# Patient Record
Sex: Female | Born: 1985 | Race: White | Hispanic: No | Marital: Married | Smoking: Never smoker
Health system: Southern US, Community
[De-identification: ages and names within clinical notes are randomized; demographics above are authoritative.]

## PROBLEM LIST (undated history)

## (undated) DIAGNOSIS — H9190 Unspecified hearing loss, unspecified ear: Secondary | ICD-10-CM

## (undated) DIAGNOSIS — N189 Chronic kidney disease, unspecified: Secondary | ICD-10-CM

## (undated) DIAGNOSIS — F419 Anxiety disorder, unspecified: Secondary | ICD-10-CM

## (undated) DIAGNOSIS — Z9889 Other specified postprocedural states: Secondary | ICD-10-CM

## (undated) DIAGNOSIS — R112 Nausea with vomiting, unspecified: Secondary | ICD-10-CM

## (undated) DIAGNOSIS — R519 Headache, unspecified: Secondary | ICD-10-CM

## (undated) DIAGNOSIS — F329 Major depressive disorder, single episode, unspecified: Secondary | ICD-10-CM

## (undated) DIAGNOSIS — N200 Calculus of kidney: Secondary | ICD-10-CM

## (undated) DIAGNOSIS — F32A Depression, unspecified: Secondary | ICD-10-CM

## (undated) HISTORY — PX: INNER EAR SURGERY: SHX679

## (undated) HISTORY — PX: KIDNEY STONE SURGERY: SHX686

## (undated) HISTORY — PX: WISDOM TOOTH EXTRACTION: SHX21

---

## 2005-12-20 DIAGNOSIS — N200 Calculus of kidney: Secondary | ICD-10-CM

## 2005-12-20 HISTORY — DX: Calculus of kidney: N20.0

## 2006-01-30 ENCOUNTER — Emergency Department (HOSPITAL_COMMUNITY): Admission: EM | Admit: 2006-01-30 | Discharge: 2006-01-30 | Payer: Self-pay | Admitting: Emergency Medicine

## 2015-02-11 LAB — OB RESULTS CONSOLE ABO/RH: RH Type: POSITIVE

## 2015-02-11 LAB — OB RESULTS CONSOLE RPR: RPR: NONREACTIVE

## 2015-02-11 LAB — OB RESULTS CONSOLE HIV ANTIBODY (ROUTINE TESTING): HIV: NONREACTIVE

## 2015-02-11 LAB — OB RESULTS CONSOLE RUBELLA ANTIBODY, IGM: RUBELLA: IMMUNE

## 2015-02-11 LAB — OB RESULTS CONSOLE HEPATITIS B SURFACE ANTIGEN: HEP B S AG: NEGATIVE

## 2015-08-24 ENCOUNTER — Inpatient Hospital Stay (HOSPITAL_COMMUNITY): Payer: 59

## 2015-08-24 ENCOUNTER — Encounter (HOSPITAL_COMMUNITY): Payer: Self-pay | Admitting: *Deleted

## 2015-08-24 ENCOUNTER — Observation Stay (HOSPITAL_COMMUNITY)
Admission: AD | Admit: 2015-08-24 | Discharge: 2015-08-25 | Disposition: A | Discharge status: Home / Self Care | Payer: 59 | Source: Ambulatory Visit | Attending: Obstetrics and Gynecology | Admitting: Obstetrics and Gynecology

## 2015-08-24 DIAGNOSIS — O4693 Antepartum hemorrhage, unspecified, third trimester: Principal | ICD-10-CM | POA: Insufficient documentation

## 2015-08-24 DIAGNOSIS — O468X3 Other antepartum hemorrhage, third trimester: Secondary | ICD-10-CM | POA: Diagnosis not present

## 2015-08-24 DIAGNOSIS — O26833 Pregnancy related renal disease, third trimester: Secondary | ICD-10-CM | POA: Diagnosis not present

## 2015-08-24 DIAGNOSIS — N189 Chronic kidney disease, unspecified: Secondary | ICD-10-CM | POA: Insufficient documentation

## 2015-08-24 DIAGNOSIS — Z3A36 36 weeks gestation of pregnancy: Secondary | ICD-10-CM | POA: Insufficient documentation

## 2015-08-24 DIAGNOSIS — N939 Abnormal uterine and vaginal bleeding, unspecified: Secondary | ICD-10-CM

## 2015-08-24 HISTORY — DX: Calculus of kidney: N20.0

## 2015-08-24 HISTORY — DX: Chronic kidney disease, unspecified: N18.9

## 2015-08-24 HISTORY — DX: Unspecified hearing loss, unspecified ear: H91.90

## 2015-08-24 LAB — CBC
HCT: 34.1 % — ABNORMAL LOW (ref 36.0–46.0)
Hemoglobin: 11.4 g/dL — ABNORMAL LOW (ref 12.0–15.0)
MCH: 28.9 pg (ref 26.0–34.0)
MCHC: 33.4 g/dL (ref 30.0–36.0)
MCV: 86.5 fL (ref 78.0–100.0)
PLATELETS: 128 10*3/uL — AB (ref 150–400)
RBC: 3.94 MIL/uL (ref 3.87–5.11)
RDW: 13.8 % (ref 11.5–15.5)
WBC: 10 10*3/uL (ref 4.0–10.5)

## 2015-08-24 LAB — URINE MICROSCOPIC-ADD ON

## 2015-08-24 LAB — URINALYSIS, ROUTINE W REFLEX MICROSCOPIC
BILIRUBIN URINE: NEGATIVE
Glucose, UA: NEGATIVE mg/dL
Ketones, ur: NEGATIVE mg/dL
LEUKOCYTES UA: NEGATIVE
NITRITE: NEGATIVE
PH: 5.5 (ref 5.0–8.0)
Protein, ur: NEGATIVE mg/dL
SPECIFIC GRAVITY, URINE: 1.01 (ref 1.005–1.030)
Urobilinogen, UA: 0.2 mg/dL (ref 0.0–1.0)

## 2015-08-24 MED ORDER — PRENATAL MULTIVITAMIN CH: 1.0000 | ORAL_TABLET | Freq: Every day | ORAL | Status: DC

## 2015-08-24 MED ORDER — DOCUSATE SODIUM 100 MG PO CAPS: 100.0000 mg | ORAL_CAPSULE | Freq: Every day | ORAL | Status: DC

## 2015-08-24 MED ORDER — CALCIUM CARBONATE ANTACID 500 MG PO CHEW: 2.0000 | CHEWABLE_TABLET | ORAL | Status: DC | PRN

## 2015-08-24 MED ORDER — ACETAMINOPHEN 325 MG PO TABS: 650.0000 mg | ORAL_TABLET | ORAL | Status: DC | PRN

## 2015-08-24 MED ORDER — ZOLPIDEM TARTRATE 5 MG PO TABS: 5.0000 mg | ORAL_TABLET | Freq: Every evening | ORAL | Status: DC | PRN

## 2015-08-24 NOTE — MAU Provider Note (Signed)
History     CSN: [id number]  Arrival date and time: 08/24/15 1008   First Provider Initiated Contact with Patient 08/24/15 1054      Chief Complaint  Patient presents with  . Vaginal Bleeding   HPI [name] 29 y.o. G2P1001 @[redacted]w[redacted]d  presents to MAU complaining of vaginal bleeding.  She noticed a few spots on underwear on awakening.  In the bathroom she had a few spots upon wiping.  A little later she noted a few more drops.  She has been wearing a panty liner and noted a small amt on that.  No soaking of pads or soiling clothes.  Baby is moving well.  She denies LOF, dysuria, contractions or recent IC.   OB History    Gravida Para Term Preterm AB TAB SAB Ectopic Multiple Living   2 1 1       1       Past Medical History  Diagnosis Date  . Hard of hearing     Past Surgical History  Procedure Laterality Date  . Cesarean section    . Kidney stone surgery    . Wisdom tooth extraction      Family History  Problem Relation Age of Onset  . Hypertension Mother   . Hypertension Father   . Diabetes Maternal Grandfather   . Cancer Maternal Grandfather   . Heart disease Maternal Grandfather   . Diabetes Paternal Grandmother   . Cancer Paternal Grandfather     Social History  Substance Use Topics  . Smoking status: Never Smoker   . Smokeless tobacco: None  . Alcohol Use: No    Allergies: Not on File  No prescriptions prior to admission    ROS Pertinent ROS in HPI.  All other systems are negative.   Physical Exam   Blood pressure 155/93, pulse 86, temperature 98.5 F (36.9 C), temperature source Oral, resp. rate 18. Blood pressure improved at bedside.  Physical Exam  Constitutional: She is oriented to person, place, and time. She appears well-developed and well-nourished. No distress.  HENT:  Head: Normocephalic and atraumatic.  Eyes: EOM are normal.  Neck: Normal range of motion.  Cardiovascular: Normal rate.   Respiratory: No respiratory distress.  GI: Soft.  There is no tenderness.  Genitourinary:  Cervix is posterior and closed.   No blood noted on glove  Musculoskeletal: Normal range of motion.  Neurological: She is alert and oriented to person, place, and time.  Skin: Skin is warm and dry.  Psychiatric: She has a normal mood and affect.   Fetal Tracing: Baseline:120s Variability:mod Accelerations:15x15s  Decelerations:none Toco:none  Results for orders placed or performed during the hospital encounter of 08/24/15 (from the past 24 hour(s))  Urinalysis, Routine w reflex microscopic (not at [facility])     Status: Abnormal   Collection Time: 08/24/15 10:22 AM  Result Value Ref Range   Color, Urine YELLOW YELLOW   APPearance HAZY (A) CLEAR   Specific Gravity, Urine 1.010 1.005 - 1.030   pH 5.5 5.0 - 8.0   Glucose, UA NEGATIVE NEGATIVE mg/dL   Hgb urine dipstick LARGE (A) NEGATIVE   Bilirubin Urine NEGATIVE NEGATIVE   Ketones, ur NEGATIVE NEGATIVE mg/dL   Protein, ur NEGATIVE NEGATIVE mg/dL   Urobilinogen, UA 0.2 0.0 - 1.0 mg/dL   Nitrite NEGATIVE NEGATIVE   Leukocytes, UA NEGATIVE NEGATIVE  Urine microscopic-add on     Status: Abnormal   Collection Time: 08/24/15 10:22 AM  Result Value Ref Range   Squamous Epithelial /  LPF FEW (A) RARE   WBC, UA 0-2 <3 WBC/hpf   RBC / HPF 11-20 <3 RBC/hpf   Bacteria, UA FEW (A) RARE     MAU Course  Procedures  MDM Called to discuss with Dr. Arelia Sneddon.  No bleeding seen on exam.  Fetal tracing is reactive.  Large Hbg on U/A.  MD advises for BPP with AFI and look at placenta, CBC.  Urine culture to eval large hgb.   Results for orders placed or performed during the hospital encounter of 08/24/15 (from the past 24 hour(s))  Urinalysis, Routine w reflex microscopic (not at [facility])     Status: Abnormal   Collection Time: 08/24/15 10:22 AM  Result Value Ref Range   Color, Urine YELLOW YELLOW   APPearance HAZY (A) CLEAR   Specific Gravity, Urine 1.010 1.005 - 1.030   pH 5.5 5.0 - 8.0   Glucose, UA  NEGATIVE NEGATIVE mg/dL   Hgb urine dipstick LARGE (A) NEGATIVE   Bilirubin Urine NEGATIVE NEGATIVE   Ketones, ur NEGATIVE NEGATIVE mg/dL   Protein, ur NEGATIVE NEGATIVE mg/dL   Urobilinogen, UA 0.2 0.0 - 1.0 mg/dL   Nitrite NEGATIVE NEGATIVE   Leukocytes, UA NEGATIVE NEGATIVE  Urine microscopic-add on     Status: Abnormal   Collection Time: 08/24/15 10:22 AM  Result Value Ref Range   Squamous Epithelial / LPF FEW (A) RARE   WBC, UA 0-2 <3 WBC/hpf   RBC / HPF 11-20 <3 RBC/hpf   Bacteria, UA FEW (A) RARE  CBC     Status: Abnormal   Collection Time: 08/24/15 12:27 PM  Result Value Ref Range   WBC 10.0 4.0 - 10.5 K/uL   RBC 3.94 3.87 - 5.11 MIL/uL   Hemoglobin 11.4 (L) 12.0 - 15.0 g/dL   HCT 16.1 (L) 09.6 - 04.5 %   MCV 86.5 78.0 - 100.0 fL   MCH 28.9 26.0 - 34.0 pg   MCHC 33.4 30.0 - 36.0 g/dL   RDW 40.9 81.1 - 91.4 %   Platelets 128 (L) 150 - 400 K/uL   BPP: 8/8 but did show possible placental lake vs. Small early abruption.   Discussed with Dr. Arelia Sneddon.  Platelets today are lower than typical for pt.  Per MD - pt to be admitted for observation.     Assessment and Plan  A: vaginal bleeding third trimester  P: Admit  Bertram Denver 08/24/2015, 10:54 AM

## 2015-08-24 NOTE — H&P (Signed)
Katrina Miller is a 29 y.o. female presenting at 83 weeks with evidence of vaginal bleeding per patient.  Exam and maternity admissions revealed no evidence of blood per vagina. Cervix was long and closed. Ultrasound was performed. Infant had a biophysical profile out of 8 out of 8. Was an area of the placenta that look like a placental lake. He could not exclude a small abruption. Monitoring no contractions were noted. It'll heart rate was reactive. Patient will be admitted overnight for observation. Maternal Medical History:  Prenatal complications: no prenatal complications Prenatal Complications - Diabetes: none.    OB History    Gravida Para Term Preterm AB TAB SAB Ectopic Multiple Living   Past Medical History  Diagnosis Date  . Hard of hearing   . Chronic kidney disease   . Kidney calculi    Past Surgical History  Procedure Laterality Date  . Cesarean section    . Kidney stone surgery    . Wisdom tooth extraction     Family History: family history includes Cancer in her maternal grandfather and paternal grandfather; Diabetes in her maternal grandfather and paternal grandmother; Heart disease in her maternal grandfather; Hypertension in her father and mother. Social History:  reports that she has never smoked. She does not have any smokeless tobacco history on file. She reports that she does not drink alcohol or use illicit drugs.   Prenatal Transfer Tool  Maternal Diabetes: No Genetic Screening: Normal Maternal Ultrasounds/Referrals: Normal Fetal Ultrasounds or other Referrals:  None Maternal Substance Abuse:  No Significant Maternal Medications:  None Significant Maternal Lab Results:  None Other Comments:  None  Review of Systems  All other systems reviewed and are negative.   Dilation: Closed Exam by:: K Teague-Clark PA Blood pressure 127/85, pulse 76, temperature 98.5 F (36.9 C), temperature source Oral, resp. rate 18.   Fetal Exam Fetal  State Assessment: Category I - tracings are normal.     Physical Exam  Constitutional: She is oriented to person, place, and time. She appears well-developed and well-nourished.  Cardiovascular: Normal rate, regular rhythm and normal heart sounds.   Respiratory: Effort normal and breath sounds normal.  GI:  Gravid uterus nontender  Genitourinary:  No evidence of blood cervix long and closed  Neurological: She is alert and oriented to person, place, and time. She has normal reflexes.    Prenatal labs: ABO, Rh:   Antibody:   Rubella:   RPR:    HBsAg:    HIV:    GBS:     Assessment/Plan: IUP at 36 weeks with bleeding not evident on exam Placental lake vs abruption Admitted for observation   Arpita Fentress S 08/24/2015, 3:47 PM

## 2015-08-24 NOTE — MAU Note (Signed)
Pt states here for vaginal bleeding that began this am. Intermittent mild cramping.Last intercourse 1-2 weeks ago. Saw Dr. Arelia Sneddon last Wednesday, no exam done.

## 2015-08-25 LAB — CBC
HCT: 34.5 % — ABNORMAL LOW (ref 36.0–46.0)
HEMOGLOBIN: 11.6 g/dL — AB (ref 12.0–15.0)
MCH: 29.1 pg (ref 26.0–34.0)
MCHC: 33.6 g/dL (ref 30.0–36.0)
MCV: 86.7 fL (ref 78.0–100.0)
PLATELETS: 138 10*3/uL — AB (ref 150–400)
RBC: 3.98 MIL/uL (ref 3.87–5.11)
RDW: 13.8 % (ref 11.5–15.5)
WBC: 9.1 10*3/uL (ref 4.0–10.5)

## 2015-08-25 NOTE — Progress Notes (Signed)
Good FM, scant spotting this am, [place] reviewed, prob plac lake with BPP 8/8, has appt in am, will repeat US then

## 2015-08-25 NOTE — Discharge Instructions (Signed)
Vaginal Bleeding During Pregnancy, Third Trimester ° A small amount of bleeding (spotting) from the vagina is common in pregnancy. Sometimes the bleeding is normal and is not a problem, and sometimes it is a sign of something serious. Be sure to tell your doctor about any bleeding from your vagina right away. °HOME CARE °· Watch your condition for any changes. °· Follow your doctor's instructions about how active you can be. °· If you are on bed rest: °¨ You may need to stay in bed and only get up to use the bathroom. °¨ You may be allowed to do some activities. °¨ If you need help, make plans for someone to help you. °· Write down: °¨ The number of pads you use each day. °¨ How often you change pads. °¨ How soaked (saturated) your pads are. °· Do not use tampons. °· Do not douche. °· Do not have sex or orgasms until your doctor says it is okay. °· Follow your doctor's advice about lifting, driving, and doing physical activities. °· If you pass any tissue from your vagina, save the tissue so you can show it to your doctor. °· Only take medicines as told by your doctor. °· Do not take aspirin because it can make you bleed. °· Keep all follow-up visits as told by your doctor. °GET HELP IF:  °· You bleed from your vagina. °· You have cramps. °· You have labor pains. °· You have a fever that does not go away after you take medicine. °GET HELP RIGHT AWAY IF: °· You have very bad cramps in your back or belly (abdomen). °· You have chills. °· You have a gush of fluid from your vagina. °· You pass large clots or tissue from your vagina. °· You bleed more. °· You feel light-headed or weak. °· You pass out (faint). °· You do not feel your baby move around as much as before. °MAKE SURE YOU: °· Understand these instructions. °· Will watch your condition. °· Will get help right away if you are not doing well or get worse. °Document Released: 04/22/2014 Document Reviewed: 08/13/2013 °Exit[employer]® Patient Information ©2015 [employer]  [employer]. This information is not intended to replace advice given to you by your health care provider. Make sure you discuss any questions you have with your health care provider. ° °

## 2015-08-26 LAB — CULTURE, OB URINE

## 2015-08-26 NOTE — Discharge Summary (Signed)
Obstetric Discharge Summary Reason for Admission: observation/evaluation Prenatal Procedures: ultrasound Intrapartum Procedures: na Postpartum Procedures: na Complications-Operative and Postpartum: na HEMOGLOBIN  Date Value Ref Range Status  08/25/2015 11.6* 12.0 - 15.0 g/dL Final   HCT  Date Value Ref Range Status  08/25/2015 34.5* 36.0 - 46.0 % Final    Physical Exam:  General: alert and cooperative Lochia: na Uterine Fundus: gravid Incision: na DVT Evaluation: No evidence of DVT seen on physical exam. Negative Homan's sign. No cords or calf tenderness.  Discharge Diagnoses: observation for vaginal bleeding @ 36 weeks  Discharge Information: Date: 08/26/2015 Activity: pelvic rest Diet: routine Medications: PNV Condition: stable Instructions: patient to be seen in office in am , to call for increase vaginal bleeding or decreased  FM, contractions Discharge to: home Follow-up Information    Follow up with Juluis Mire, MD In 1 day.   Specialty:  Obstetrics and Gynecology   Contact information:   [address] [place] [address] [phone]       Newborn Data: NA  Judith Blonder 08/26/2015, 8:46 AM

## 2015-09-09 NOTE — H&P (Signed)
Katrina Miller is a 29 y.o. female presenting for repeat C/S.  Patient has had one previous C/S and declines TOLAC.  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    Maternal Medical History:  Prenatal complications: no prenatal complications Prenatal Complications - Diabetes: none.    OB History    Gravida Para Term Preterm AB TAB SAB Ectopic Multiple Living   Past Medical History  Diagnosis Date  . Hard of hearing   . Chronic kidney disease   . Kidney calculi 2007   Past Surgical History  Procedure Laterality Date  . Cesarean section    . Kidney stone surgery    . Wisdom tooth extraction     Family History: family history includes Cancer in her maternal grandfather and paternal grandfather; Diabetes in her maternal grandfather and paternal grandmother; Heart disease in her maternal grandfather; Hypertension in her father and mother. Social History:  reports that she has never smoked. She does not have any smokeless tobacco history on file. She reports that she does not drink alcohol or use illicit drugs.   Prenatal Transfer Tool  Maternal Diabetes: No Genetic Screening: Normal Maternal Ultrasounds/Referrals: Normal Fetal Ultrasounds or other Referrals:  None Maternal Substance Abuse:  No Significant Maternal Medications:  None Significant Maternal Lab Results:  Lab values include: Other: CF carrier; FOB declined testing Other Comments:  None  ROS    There were no vitals taken for this visit. Maternal Exam:  Abdomen: Surgical scars: low transverse.   Fundal height is c/w dates.   Estimated fetal weight is 7#8.       Physical Exam  Constitutional: She is oriented to person, place, and time. She appears well-developed and well-nourished.  GI: Soft. There is no rebound and no guarding.  Neurological: She is alert and oriented to person, place, and time.  Skin: Skin is warm and dry.  Psychiatric: She  has a normal mood and affect. Her behavior is normal.    Prenatal labs: ABO, Rh: O/Positive/-- (02/23 0000) Antibody:   Rubella: Immune (02/23 0000) RPR: Nonreactive (02/23 0000)  HBsAg: Negative (02/23 0000)  HIV: Non-reactive (02/23 0000)  GBS:     Assessment/Plan: 29 yo G2P1 at 39 weeks with previous C/S -Rpt C/S -Patient understands risk of bleeding, infection, scarring, and damage to surrounding structures.  All questions were answered and the patient wishes to proceed.   MORRIS, MEGAN 09/09/2015, 10:53 PM

## 2015-09-16 ENCOUNTER — Encounter (HOSPITAL_COMMUNITY): Payer: Self-pay

## 2015-09-16 ENCOUNTER — Encounter (HOSPITAL_COMMUNITY)
Admission: RE | Admit: 2015-09-16 | Discharge: 2015-09-16 | Disposition: A | Discharge status: Home / Self Care | Payer: Managed Care, Other (non HMO) | Source: Ambulatory Visit | Attending: Obstetrics & Gynecology | Admitting: Obstetrics & Gynecology

## 2015-09-16 HISTORY — DX: Major depressive disorder, single episode, unspecified: F32.9

## 2015-09-16 HISTORY — DX: Anxiety disorder, unspecified: F41.9

## 2015-09-16 HISTORY — DX: Depression, unspecified: F32.A

## 2015-09-16 LAB — ABO/RH: ABO/RH(D): O POS

## 2015-09-16 LAB — CBC
HEMATOCRIT: 37.1 % (ref 36.0–46.0)
Hemoglobin: 12.5 g/dL (ref 12.0–15.0)
MCH: 29.1 pg (ref 26.0–34.0)
MCHC: 33.7 g/dL (ref 30.0–36.0)
MCV: 86.3 fL (ref 78.0–100.0)
PLATELETS: 144 10*3/uL — AB (ref 150–400)
RBC: 4.3 MIL/uL (ref 3.87–5.11)
RDW: 14.6 % (ref 11.5–15.5)
WBC: 9.9 10*3/uL (ref 4.0–10.5)

## 2015-09-16 LAB — TYPE AND SCREEN
ABO/RH(D): O POS
Antibody Screen: NEGATIVE

## 2015-09-16 NOTE — Patient Instructions (Signed)
Your procedure is scheduled on:09/18/15  Enter through the Main Entrance at :6am Pick up desk phone and dial [phone] and inform us of your arrival.  Please call [phone] if you have any problems the morning of surgery.  Remember: Do not eat food or drink liquids after midnight:WED Water is ok until 3:30 am on WED    You may brush your teeth the morning of surgery.   DO NOT wear jewelry, eye make-up, lipstick,body lotion, or dark fingernail polish.  (Polished toes are ok) You may wear deodorant.  If you are to be admitted after surgery, leave suitcase in car until your room has been assigned. Patients discharged on the day of surgery will not be allowed to drive home. Wear loose fitting, comfortable clothes for your ride home.

## 2015-09-17 LAB — RPR: RPR Ser Ql: NONREACTIVE

## 2015-09-17 MED ORDER — GENTAMICIN SULFATE 40 MG/ML IJ SOLN
INTRAMUSCULAR | Status: DC
  Filled 2015-09-17: qty 8.75

## 2015-09-18 ENCOUNTER — Inpatient Hospital Stay (HOSPITAL_COMMUNITY)
Admission: RE | Admit: 2015-09-18 | Discharge: 2015-09-20 | DRG: 766 | Disposition: A | Discharge status: Home / Self Care | Payer: Managed Care, Other (non HMO) | Source: Ambulatory Visit | Attending: Obstetrics & Gynecology | Admitting: Obstetrics & Gynecology

## 2015-09-18 ENCOUNTER — Encounter (HOSPITAL_COMMUNITY): Payer: Self-pay

## 2015-09-18 ENCOUNTER — Inpatient Hospital Stay (HOSPITAL_COMMUNITY): Payer: Managed Care, Other (non HMO) | Admitting: Anesthesiology

## 2015-09-18 ENCOUNTER — Encounter (HOSPITAL_COMMUNITY)
Admission: RE | Disposition: A | Discharge status: Home / Self Care | Payer: Self-pay | Source: Ambulatory Visit | Attending: Obstetrics & Gynecology

## 2015-09-18 ENCOUNTER — Inpatient Hospital Stay (HOSPITAL_COMMUNITY)
Admission: AD | Admit: 2015-09-18 | Payer: Managed Care, Other (non HMO) | Source: Ambulatory Visit | Admitting: Obstetrics & Gynecology

## 2015-09-18 DIAGNOSIS — O34219 Maternal care for unspecified type scar from previous cesarean delivery: Secondary | ICD-10-CM | POA: Diagnosis present

## 2015-09-18 DIAGNOSIS — Z98891 History of uterine scar from previous surgery: Secondary | ICD-10-CM

## 2015-09-18 DIAGNOSIS — Z880 Allergy status to penicillin: Secondary | ICD-10-CM | POA: Diagnosis not present

## 2015-09-18 DIAGNOSIS — Z809 Family history of malignant neoplasm, unspecified: Secondary | ICD-10-CM

## 2015-09-18 DIAGNOSIS — F329 Major depressive disorder, single episode, unspecified: Secondary | ICD-10-CM | POA: Diagnosis present

## 2015-09-18 DIAGNOSIS — O99344 Other mental disorders complicating childbirth: Secondary | ICD-10-CM | POA: Diagnosis present

## 2015-09-18 DIAGNOSIS — F419 Anxiety disorder, unspecified: Secondary | ICD-10-CM | POA: Diagnosis present

## 2015-09-18 DIAGNOSIS — O34211 Maternal care for low transverse scar from previous cesarean delivery: Principal | ICD-10-CM | POA: Diagnosis present

## 2015-09-18 DIAGNOSIS — Z8249 Family history of ischemic heart disease and other diseases of the circulatory system: Secondary | ICD-10-CM

## 2015-09-18 DIAGNOSIS — Z3A39 39 weeks gestation of pregnancy: Secondary | ICD-10-CM | POA: Diagnosis not present

## 2015-09-18 DIAGNOSIS — Z833 Family history of diabetes mellitus: Secondary | ICD-10-CM | POA: Diagnosis not present

## 2015-09-18 SURGERY — Surgical Case
Anesthesia: Spinal | Site: Abdomen

## 2015-09-18 SURGERY — Surgical Case
Anesthesia: Regional

## 2015-09-18 MED ORDER — DIBUCAINE 1 % RE OINT: 1.0000 "application " | TOPICAL_OINTMENT | RECTAL | Status: DC | PRN

## 2015-09-18 MED ORDER — PHENYLEPHRINE 8 MG IN D5W 100 ML (0.08MG/ML) PREMIX OPTIME
INJECTION | INTRAVENOUS | Status: AC
  Filled 2015-09-18: qty 100

## 2015-09-18 MED ORDER — TRIAMCINOLONE ACETONIDE 40 MG/ML IJ SUSP
40.0000 mg | Freq: Once | INTRAMUSCULAR | Status: AC
  Administered 2015-09-18: 40 mg via INTRAMUSCULAR
  Filled 2015-09-18: qty 1

## 2015-09-18 MED ORDER — KETOROLAC TROMETHAMINE 30 MG/ML IJ SOLN: 30.0000 mg | Freq: Four times a day (QID) | INTRAMUSCULAR | Status: AC | PRN

## 2015-09-18 MED ORDER — SIMETHICONE 80 MG PO CHEW: 80.0000 mg | CHEWABLE_TABLET | ORAL | Status: DC | PRN

## 2015-09-18 MED ORDER — BUPIVACAINE HCL (PF) 0.25 % IJ SOLN
INTRAMUSCULAR | Status: AC
  Filled 2015-09-18: qty 10

## 2015-09-18 MED ORDER — ONDANSETRON HCL 4 MG/2ML IJ SOLN: 4.0000 mg | Freq: Three times a day (TID) | INTRAMUSCULAR | Status: DC | PRN

## 2015-09-18 MED ORDER — SIMETHICONE 80 MG PO CHEW
80.0000 mg | CHEWABLE_TABLET | Freq: Three times a day (TID) | ORAL | Status: DC
  Administered 2015-09-18 – 2015-09-19 (×4): 80 mg via ORAL
  Filled 2015-09-18 (×4): qty 1

## 2015-09-18 MED ORDER — KETOROLAC TROMETHAMINE 30 MG/ML IJ SOLN
INTRAMUSCULAR | Status: AC
  Filled 2015-09-18: qty 1

## 2015-09-18 MED ORDER — ONDANSETRON HCL 4 MG/2ML IJ SOLN
INTRAMUSCULAR | Status: DC | PRN
  Administered 2015-09-18: 4 mg via INTRAVENOUS

## 2015-09-18 MED ORDER — DIPHENHYDRAMINE HCL 25 MG PO CAPS: 25.0000 mg | ORAL_CAPSULE | Freq: Four times a day (QID) | ORAL | Status: DC | PRN

## 2015-09-18 MED ORDER — SCOPOLAMINE 1 MG/3DAYS TD PT72
MEDICATED_PATCH | TRANSDERMAL | Status: AC
Start: 2015-09-18 — End: 2015-09-18
  Filled 2015-09-18: qty 1

## 2015-09-18 MED ORDER — NALBUPHINE HCL 10 MG/ML IJ SOLN
5.0000 mg | INTRAMUSCULAR | Status: DC | PRN
  Filled 2015-09-18: qty 0.5

## 2015-09-18 MED ORDER — NALBUPHINE HCL 10 MG/ML IJ SOLN
5.0000 mg | Freq: Once | INTRAMUSCULAR | Status: DC | PRN
  Filled 2015-09-18: qty 0.5

## 2015-09-18 MED ORDER — FENTANYL CITRATE (PF) 100 MCG/2ML IJ SOLN: 25.0000 ug | INTRAMUSCULAR | Status: DC | PRN

## 2015-09-18 MED ORDER — ZOLPIDEM TARTRATE 5 MG PO TABS: 5.0000 mg | ORAL_TABLET | Freq: Every evening | ORAL | Status: DC | PRN

## 2015-09-18 MED ORDER — DIPHENHYDRAMINE HCL 50 MG/ML IJ SOLN: 12.5000 mg | INTRAMUSCULAR | Status: DC | PRN

## 2015-09-18 MED ORDER — OXYCODONE-ACETAMINOPHEN 5-325 MG PO TABS: 2.0000 | ORAL_TABLET | ORAL | Status: DC | PRN

## 2015-09-18 MED ORDER — SCOPOLAMINE 1 MG/3DAYS TD PT72
MEDICATED_PATCH | TRANSDERMAL | Status: DC
Start: 2015-09-18 — End: 2015-09-20
  Administered 2015-09-18: 1.5 mg via TRANSDERMAL
  Filled 2015-09-18: qty 1

## 2015-09-18 MED ORDER — ONDANSETRON HCL 4 MG/2ML IJ SOLN
INTRAMUSCULAR | Status: AC
  Filled 2015-09-18: qty 2

## 2015-09-18 MED ORDER — LACTATED RINGERS IV SOLN
Freq: Once | INTRAVENOUS | Status: AC
  Administered 2015-09-18: 07:00:00 via INTRAVENOUS

## 2015-09-18 MED ORDER — LACTATED RINGERS IV SOLN
INTRAVENOUS | Status: DC
  Administered 2015-09-18 (×3): via INTRAVENOUS

## 2015-09-18 MED ORDER — SCOPOLAMINE 1 MG/3DAYS TD PT72
1.0000 | MEDICATED_PATCH | Freq: Once | TRANSDERMAL | Status: DC
  Filled 2015-09-18: qty 1

## 2015-09-18 MED ORDER — BUPIVACAINE HCL (PF) 0.25 % IJ SOLN
INTRAMUSCULAR | Status: DC | PRN
  Administered 2015-09-18: 10 mL

## 2015-09-18 MED ORDER — DIPHENHYDRAMINE HCL 25 MG PO CAPS: 25.0000 mg | ORAL_CAPSULE | ORAL | Status: DC | PRN

## 2015-09-18 MED ORDER — OXYCODONE-ACETAMINOPHEN 5-325 MG PO TABS
1.0000 | ORAL_TABLET | ORAL | Status: DC | PRN
  Administered 2015-09-18 – 2015-09-20 (×4): 1 via ORAL
  Filled 2015-09-18 (×4): qty 1

## 2015-09-18 MED ORDER — LACTATED RINGERS IV SOLN: INTRAVENOUS | Status: DC

## 2015-09-18 MED ORDER — LANOLIN HYDROUS EX OINT: 1.0000 "application " | TOPICAL_OINTMENT | CUTANEOUS | Status: DC | PRN

## 2015-09-18 MED ORDER — SODIUM CHLORIDE 0.9 % IJ SOLN: 3.0000 mL | INTRAMUSCULAR | Status: DC | PRN

## 2015-09-18 MED ORDER — SIMETHICONE 80 MG PO CHEW
80.0000 mg | CHEWABLE_TABLET | ORAL | Status: DC
  Administered 2015-09-19 (×2): 80 mg via ORAL
  Filled 2015-09-18 (×2): qty 1

## 2015-09-18 MED ORDER — DEXTROSE 5 % IV SOLN
1.0000 ug/kg/h | INTRAVENOUS | Status: DC | PRN
  Filled 2015-09-18: qty 2

## 2015-09-18 MED ORDER — MORPHINE SULFATE (PF) 0.5 MG/ML IJ SOLN
INTRAMUSCULAR | Status: DC | PRN
  Administered 2015-09-18: .2 mg via INTRATHECAL

## 2015-09-18 MED ORDER — GENTAMICIN SULFATE 40 MG/ML IJ SOLN: 350.0000 mg | Freq: Once | INTRAVENOUS | Status: DC

## 2015-09-18 MED ORDER — BUPIVACAINE IN DEXTROSE 0.75-8.25 % IT SOLN
INTRATHECAL | Status: DC | PRN
  Administered 2015-09-18: 1.6 mL via INTRATHECAL

## 2015-09-18 MED ORDER — OXYTOCIN 10 UNIT/ML IJ SOLN
INTRAMUSCULAR | Status: AC
  Filled 2015-09-18: qty 4

## 2015-09-18 MED ORDER — PHENYLEPHRINE 8 MG IN D5W 100 ML (0.08MG/ML) PREMIX OPTIME
INJECTION | INTRAVENOUS | Status: DC | PRN
  Administered 2015-09-18: 60 ug/min via INTRAVENOUS

## 2015-09-18 MED ORDER — MEPERIDINE HCL 25 MG/ML IJ SOLN: 6.2500 mg | INTRAMUSCULAR | Status: DC | PRN

## 2015-09-18 MED ORDER — OXYTOCIN 40 UNITS IN LACTATED RINGERS INFUSION - SIMPLE MED: 62.5000 mL/h | INTRAVENOUS | Status: AC

## 2015-09-18 MED ORDER — NALOXONE HCL 0.4 MG/ML IJ SOLN: 0.4000 mg | INTRAMUSCULAR | Status: DC | PRN

## 2015-09-18 MED ORDER — MORPHINE SULFATE (PF) 0.5 MG/ML IJ SOLN
INTRAMUSCULAR | Status: AC
  Filled 2015-09-18: qty 100

## 2015-09-18 MED ORDER — IBUPROFEN 600 MG PO TABS
600.0000 mg | ORAL_TABLET | Freq: Four times a day (QID) | ORAL | Status: DC
  Administered 2015-09-18 – 2015-09-20 (×7): 600 mg via ORAL
  Filled 2015-09-18 (×7): qty 1

## 2015-09-18 MED ORDER — FENTANYL CITRATE (PF) 100 MCG/2ML IJ SOLN
INTRAMUSCULAR | Status: DC | PRN
  Administered 2015-09-18: 20 ug via INTRATHECAL

## 2015-09-18 MED ORDER — 0.9 % SODIUM CHLORIDE (POUR BTL) OPTIME
TOPICAL | Status: DC | PRN
  Administered 2015-09-18: 1000 mL

## 2015-09-18 MED ORDER — ACETAMINOPHEN 325 MG PO TABS: 650.0000 mg | ORAL_TABLET | ORAL | Status: DC | PRN

## 2015-09-18 MED ORDER — SCOPOLAMINE 1 MG/3DAYS TD PT72
1.0000 | MEDICATED_PATCH | Freq: Once | TRANSDERMAL | Status: DC
  Administered 2015-09-18: 1.5 mg via TRANSDERMAL

## 2015-09-18 MED ORDER — MENTHOL 3 MG MT LOZG: 1.0000 | LOZENGE | OROMUCOSAL | Status: DC | PRN

## 2015-09-18 MED ORDER — OXYTOCIN 10 UNIT/ML IJ SOLN
40.0000 [IU] | INTRAVENOUS | Status: DC | PRN
  Administered 2015-09-18: 40 [IU] via INTRAVENOUS

## 2015-09-18 MED ORDER — KETOROLAC TROMETHAMINE 30 MG/ML IJ SOLN
30.0000 mg | Freq: Four times a day (QID) | INTRAMUSCULAR | Status: AC | PRN
  Administered 2015-09-18: 30 mg via INTRAMUSCULAR

## 2015-09-18 MED ORDER — DEXAMETHASONE SODIUM PHOSPHATE 4 MG/ML IJ SOLN
INTRAMUSCULAR | Status: DC | PRN
  Administered 2015-09-18: 4 mg via INTRAVENOUS

## 2015-09-18 MED ORDER — PRENATAL MULTIVITAMIN CH
1.0000 | ORAL_TABLET | Freq: Every day | ORAL | Status: DC
Start: 2015-09-18 — End: 2015-09-20
  Administered 2015-09-19: 1 via ORAL
  Filled 2015-09-18: qty 1

## 2015-09-18 MED ORDER — LACTATED RINGERS IV SOLN
INTRAVENOUS | Status: DC
  Administered 2015-09-18: 17:00:00 via INTRAVENOUS

## 2015-09-18 MED ORDER — CLINDAMYCIN PHOSPHATE 900 MG/50ML IV SOLN: 900.0000 mg | Freq: Once | INTRAVENOUS | Status: DC

## 2015-09-18 MED ORDER — TETANUS-DIPHTH-ACELL PERTUSSIS 5-2.5-18.5 LF-MCG/0.5 IM SUSP: 0.5000 mL | Freq: Once | INTRAMUSCULAR | Status: DC

## 2015-09-18 MED ORDER — FENTANYL CITRATE (PF) 100 MCG/2ML IJ SOLN
INTRAMUSCULAR | Status: AC
  Filled 2015-09-18: qty 4

## 2015-09-18 MED ORDER — SENNOSIDES-DOCUSATE SODIUM 8.6-50 MG PO TABS
2.0000 | ORAL_TABLET | ORAL | Status: DC
  Administered 2015-09-19 (×2): 2 via ORAL
  Filled 2015-09-18 (×2): qty 2

## 2015-09-18 MED ORDER — WITCH HAZEL-GLYCERIN EX PADS: 1.0000 "application " | MEDICATED_PAD | CUTANEOUS | Status: DC | PRN

## 2015-09-18 MED ORDER — GENTAMICIN SULFATE 40 MG/ML IJ SOLN
INTRAVENOUS | Status: AC
  Administered 2015-09-18: 114.75 mL via INTRAVENOUS
  Filled 2015-09-18: qty 8.75

## 2015-09-18 SURGICAL SUPPLY — 31 items
BENZOIN TINCTURE PRP APPL 2/3 (GAUZE/BANDAGES/DRESSINGS) ×3 IMPLANT
CLAMP CORD UMBIL (MISCELLANEOUS) ×3 IMPLANT
CLOSURE WOUND 1/2 X4 (GAUZE/BANDAGES/DRESSINGS) ×1
CLOTH BEACON ORANGE TIMEOUT ST (SAFETY) ×3 IMPLANT
DRAPE SHEET LG 3/4 BI-LAMINATE (DRAPES) ×3 IMPLANT
DRSG OPSITE POSTOP 4X10 (GAUZE/BANDAGES/DRESSINGS) ×3 IMPLANT
DURAPREP 26ML APPLICATOR (WOUND CARE) ×3 IMPLANT
ELECT REM PT RETURN 9FT ADLT (ELECTROSURGICAL) ×3
ELECTRODE REM PT RTRN 9FT ADLT (ELECTROSURGICAL) ×1 IMPLANT
GLOVE BIO SURGEON STRL SZ 6 (GLOVE) ×3 IMPLANT
GLOVE BIOGEL PI IND STRL 6 (GLOVE) ×2 IMPLANT
GLOVE BIOGEL PI INDICATOR 6 (GLOVE) ×4
GOWN STRL REUS W/TWL LRG LVL3 (GOWN DISPOSABLE) ×6 IMPLANT
KIT ABG SYR 3ML LUER SLIP (SYRINGE) ×3 IMPLANT
LIQUID BAND (GAUZE/BANDAGES/DRESSINGS) IMPLANT
NEEDLE HYPO 22GX1.5 SAFETY (NEEDLE) ×3 IMPLANT
NEEDLE HYPO 25X5/8 SAFETYGLIDE (NEEDLE) ×3 IMPLANT
NS IRRIG 1000ML POUR BTL (IV SOLUTION) ×3 IMPLANT
PACK C SECTION WH (CUSTOM PROCEDURE TRAY) ×3 IMPLANT
PAD OB MATERNITY 4.3X12.25 (PERSONAL CARE ITEMS) ×3 IMPLANT
PENCIL SMOKE EVAC W/HOLSTER (ELECTROSURGICAL) ×3 IMPLANT
STRIP CLOSURE SKIN 1/2X4 (GAUZE/BANDAGES/DRESSINGS) ×2 IMPLANT
SUT CHROMIC 0 CTX 36 (SUTURE) ×9 IMPLANT
SUT MON AB 2-0 CT1 27 (SUTURE) ×3 IMPLANT
SUT PDS AB 0 CT1 27 (SUTURE) ×3 IMPLANT
SUT PLAIN 2 0 XLH (SUTURE) ×3 IMPLANT
SUT VIC AB 4-0 KS 27 (SUTURE) ×3 IMPLANT
SYR CONTROL 10ML LL (SYRINGE) ×3 IMPLANT
SYR TB 1ML 25GX5/8 (SYRINGE) ×3 IMPLANT
TOWEL OR 17X24 6PK STRL BLUE (TOWEL DISPOSABLE) ×3 IMPLANT
TRAY FOLEY CATH SILVER 14FR (SET/KITS/TRAYS/PACK) ×3 IMPLANT

## 2015-09-18 NOTE — Progress Notes (Signed)
No change to H&P.  Megan Morris, DO 

## 2015-09-18 NOTE — Anesthesia Postprocedure Evaluation (Signed)
  Anesthesia Post-op Note  Patient: Katrina Miller  Procedure(s) Performed: Procedure(s): CESAREAN SECTION (N/A)  Patient Location: PACU  Anesthesia Type:Spinal  Level of Consciousness: awake  Airway and Oxygen Therapy: Patient Spontanous Breathing  Post-op Pain: none  Post-op Assessment: Post-op Vital signs reviewed, Patient's Cardiovascular Status Stable, Respiratory Function Stable, Patent Airway, No signs of Nausea or vomiting and Pain level controlled              Post-op Vital Signs: Reviewed and stable  Last Vitals:  Filed Vitals:   09/18/15 0930  BP:   Pulse: 71  Temp:   Resp: 15    Complications: No apparent anesthesia complications

## 2015-09-18 NOTE — Addendum Note (Signed)
Addendum  created 09/18/15 1313 by Renford Dills, CRNA   Modules edited: Notes Section   Notes Section:  File: [id number]

## 2015-09-18 NOTE — Op Note (Signed)
[name] PROCEDURE DATE: 09/18/2015  PREOPERATIVE DIAGNOSIS: Intrauterine pregnancy at  104w4d weeks gestation, previous C/S x 1, desire for repeat  POSTOPERATIVE DIAGNOSIS: The same  PROCEDURE:   Repeat Low Transverse Cesarean Section  SURGEON:  Dr. Mitchel Honour  INDICATIONS: Katrina Miller is a 29 y.o. G2P1001 at [redacted]w[redacted]d scheduled for cesarean section secondary to desire for repeat.  The risks of cesarean section discussed with the patient included but were not limited to: bleeding which may require transfusion or reoperation; infection which may require antibiotics; injury to bowel, bladder, ureters or other surrounding organs; injury to the fetus; need for additional procedures including hysterectomy in the event of a life-threatening hemorrhage; placental abnormalities wth subsequent pregnancies, incisional problems, thromboembolic phenomenon and other postoperative/anesthesia complications. The patient concurred with the proposed plan, giving informed written consent for the procedure.    FINDINGS:  Viable female infant in cephalic presentation, APGARs 8,9:  Weight pending.  Clear amniotic fluid.  Intact placenta, three vessel cord.  Grossly normal uterus, ovaries and fallopian tubes. .   ANESTHESIA:    Spinal ESTIMATED BLOOD LOSS: 600 ml SPECIMENS: Placenta sent to L&D COMPLICATIONS: None immediate  PROCEDURE IN DETAIL:  The patient received intravenous antibiotics and had sequential compression devices applied to her lower extremities while in the preoperative area.  She was then taken to the operating room where spinal anesthesia was administered and was found to be adequate. She was then placed in a dorsal supine position with a leftward tilt, and prepped and draped in a sterile manner.  A foley catheter was placed into her bladder and attached to constant gravity.  After an adequate timeout was performed, a Pfannenstiel skin incision was made with scalpel to include removal of prior keloid.   This incision was carried through to the underlying layer of fascia. The fascia was incised in the midline and this incision was extended bilaterally using the Mayo scissors. Kocher clamps were applied to the superior aspect of the fascial incision and the underlying rectus muscles were dissected off bluntly. A similar process was carried out on the inferior aspect of the facial incision. The rectus muscles were separated in the midline bluntly and the peritoneum was entered bluntly.  A bladder flap was created sharply and developed bluntly.  The bladder blade was placed.  A transverse hysterotomy was made with a scalpel and extended bilaterally bluntly. The bladder blade was then removed. The infant was successfully delivered, and cord was clamped and cut and infant was handed over to awaiting neonatology team. Uterine massage was then administered and the placenta delivered intact with three-vessel cord. The uterus was cleared of clot and debris.  The hysterotomy was closed with 0 Chromic.  A second imbricating suture of 0-Chromic was used to reinforce the incision and aid in hemostasis.  The peritoneum and rectus muscles were noted to be hemostatic and were reapproximated using 3-0 Monocryl.  The fascia was closed with 0-PDS in a running fashion with good restoration of anatomy.  The subcutaneus tissue was copiously irrigated and was reapproximated with 3 interrupted plain gut sutures.  The skin was closed with 4-0 Vicryl in a subcuticular fashion.    Pt tolerated the procedure will.  All counts were correct x2.  Pt went to the recovery room in stable condition.

## 2015-09-18 NOTE — Transfer of Care (Signed)
Immediate Anesthesia Transfer of Care Note  Patient: Katrina Miller  Procedure(s) Performed: Procedure(s): CESAREAN SECTION (N/A)  Patient Location: PACU  Anesthesia Type:Spinal  Level of Consciousness: awake, alert  and oriented  Airway & Oxygen Therapy: Patient Spontanous Breathing  Post-op Assessment: Report given to RN and Post -op Vital signs reviewed and stable  Post vital signs: Reviewed and stable  Last Vitals:  Filed Vitals:   09/18/15 0628  BP: 127/86  Pulse: 80  Temp: 36.6 C  Resp: 18    Complications: No apparent anesthesia complications

## 2015-09-18 NOTE — Lactation Note (Signed)
This note was copied from the chart of Katrina Miller. Lactation Consultation Note Experienced BF mom for 14 months. Used a NS for the first few months, then resolved to BF to breast. States this baby is latching w/o difficulty. Mom states she sees colostrum. Mom encouraged to do skin-to-skin. Room extremely cold, discussed temperature for baby, reminding mom of hormones and meds. Making mom hot. Educated about newborn behavior. Reviewed obtaining deep latch, monitoring I&O, & positioning. WH/LC brochure given w/resources, support groups and LC services. Patient Name: Katrina Shawneequa Baldridge [id number] Date: 09/18/2015 Reason for consult: Initial assessment   Maternal Data Has patient been taught Hand Expression?: Yes Does the patient have breastfeeding experience prior to this delivery?: Yes  Feeding Feeding Type: Breast Fed Length of feed: 30 min  LATCH Score/Interventions Latch: Grasps breast easily, tongue down, lips flanged, rhythmical sucking. Intervention(s): Adjust position;Assist with latch  Audible Swallowing: A few with stimulation Intervention(s): Skin to skin Intervention(s): Skin to skin  Type of Nipple: Everted at rest and after stimulation  Comfort (Breast/Nipple): Soft / non-tender     Hold (Positioning): Assistance needed to correctly position infant at breast and maintain latch. Intervention(s): Breastfeeding basics reviewed;Support Pillows;Position options;Skin to skin  LATCH Score: 8  Lactation Tools Discussed/Used     Consult Status Consult Status: PRN    Charyl Dancer 09/18/2015, 9:03 PM

## 2015-09-18 NOTE — Anesthesia Postprocedure Evaluation (Signed)
  Anesthesia Post-op Note  Patient: Katrina Miller  Procedure(s) Performed: Procedure(s): CESAREAN SECTION (N/A)  Patient Location: Mother/Baby  Anesthesia Type:Spinal  Level of Consciousness: awake  Airway and Oxygen Therapy: Patient Spontanous Breathing  Post-op Pain: mild  Post-op Assessment: Patient's Cardiovascular Status Stable and Respiratory Function Stable              Post-op Vital Signs: stable  Last Vitals:  Filed Vitals:   09/18/15 1240  BP: 102/59  Pulse: 70  Temp: 36.6 C  Resp: 18    Complications: No apparent anesthesia complications

## 2015-09-18 NOTE — Anesthesia Preprocedure Evaluation (Signed)
Anesthesia Evaluation  Patient identified by MRN, date of birth, ID band Patient awake    Reviewed: Allergy & Precautions, NPO status , Patient's Chart, lab work & pertinent test results  History of Anesthesia Complications Negative for: history of anesthetic complications  Airway Mallampati: II  TM Distance: >3 FB Neck ROM: Full    Dental  (+) Teeth Intact   Pulmonary neg pulmonary ROS,    breath sounds clear to auscultation       Cardiovascular negative cardio ROS   Rhythm:Regular     Neuro/Psych PSYCHIATRIC DISORDERS Anxiety Depression negative neurological ROS     GI/Hepatic negative GI ROS, Neg liver ROS,   Endo/Other  negative endocrine ROS  Renal/GU negative Renal ROS     Musculoskeletal   Abdominal   Peds  Hematology negative hematology ROS (+)   Anesthesia Other Findings   Reproductive/Obstetrics (+) Pregnancy                             Anesthesia Physical Anesthesia Plan  ASA: II  Anesthesia Plan: Spinal   Post-op Pain Management:    Induction:   Airway Management Planned: Nasal Cannula  Additional Equipment: None  Intra-op Plan:   Post-operative Plan:   Informed Consent: I have reviewed the patients History and Physical, chart, labs and discussed the procedure including the risks, benefits and alternatives for the proposed anesthesia with the patient or authorized representative who has indicated his/her understanding and acceptance.   Dental advisory given  Plan Discussed with: CRNA and Surgeon  Anesthesia Plan Comments:         Anesthesia Quick Evaluation

## 2015-09-19 LAB — CBC
HEMATOCRIT: 33.2 % — AB (ref 36.0–46.0)
HEMOGLOBIN: 10.9 g/dL — AB (ref 12.0–15.0)
MCH: 28.5 pg (ref 26.0–34.0)
MCHC: 32.8 g/dL (ref 30.0–36.0)
MCV: 86.7 fL (ref 78.0–100.0)
Platelets: 125 10*3/uL — ABNORMAL LOW (ref 150–400)
RBC: 3.83 MIL/uL — ABNORMAL LOW (ref 3.87–5.11)
RDW: 14.9 % (ref 11.5–15.5)
WBC: 11.7 10*3/uL — ABNORMAL HIGH (ref 4.0–10.5)

## 2015-09-19 LAB — BIRTH TISSUE RECOVERY COLLECTION (PLACENTA DONATION)

## 2015-09-19 NOTE — Clinical Social Work Maternal (Signed)
  CLINICAL SOCIAL WORK MATERNAL/CHILD NOTE  Patient Details  Name: Katrina Miller MRN: [id number] Date of Birth: 11/11/1986  Date:  09/19/2015  Clinical Social Worker Initiating Note:  Sarah Venning MSW, LCSW Date/ Time Initiated:  09/19/15/1430     Child's Name:  Katrina Miller   Legal Guardian:  Patches and J.Patrick Thomason  Need for Interpreter:  None   Date of Referral:  09/18/15     Reason for Referral:  History of PPD  Referral Source:  Central Nursery   Address:  [address] [place] [address]  Phone number:  [phone]   Household Members:  Minor Children, Spouse   Natural Supports (not living in the home):  Immediate Family, Extended Family   Professional Supports: None   Financial Resources:  Private Insurance   Other Resources:    None identified.  Cultural/Religious Considerations Which May Impact Care:  None reported  Strengths:  Ability to meet basic needs , Home prepared for child , Pediatrician chosen    Risk Factors/Current Problems:  1)Mental Health Concerns: MOB presents with history of postpartum depression. She stated that she was prescribed Zoloft, and expressed interest in re-start Zoloft as she transitions postpartum with this pregnancy.     Cognitive State:  Able to Concentrate , Alert , Linear Thinking , Goal Oriented    Mood/Affect:  Animated, Happy , Interested    CSW Assessment:  CSW received request for consult due to MOB presenting with a history of postpartum depression.  MOB provided consent for the MGM to remain in the room during the visit. MOB presented as easily engaged and receptive to the visit. She displayed a full range in affect and was noted to be in a pleasant mood.  MOB did not present with any acute mental health concerns, but verbalized intentions to be proactive in efforts to support her mental health as she transitions postpartum. MOB stated that prior to her first child's birth, she did not have any mental health  concerns. She reported onset of postpartum depression that occurred for led to her being prescribed Zoloft by her NP. She stated that she found it helpful in the resolution of symptoms, and her mother confirmed that it assisted to improve MOB's mood.  MOB denied any mental health concerns during the pregnancy, and shared that she currently feels "good".  MOB expressed interest in re-starting Zoloft in order to be proactive. MOB presented with insight related to her increased risk based on her history.  She stated that she felt comfortable talking with her OB prior to discharge to inquire about prescription, and declined CSW offer to consult with her OB.  MOB endorsed presence of a strong support system, and denied additional questions, concerns, or needs at this time. She expressed appreciation for the visit, acknowledged ongoing CSW availability, and agreed to contact CSW if needs arise.   CSW Plan/Description:   1)Patient/Family Education: Perinatal mood and anxiety disorders 2) MOB reported intention to speak with her OB in order to re-start Zoloft prior to discharge.  3)No Further Intervention Required/No Barriers to Discharge    Venning, Sarah N, LCSW 09/19/2015, 3:08 PM  

## 2015-09-19 NOTE — Progress Notes (Signed)
Subjective: Postpartum Day 1: Cesarean Delivery Patient reports tolerating PO, + flatus and no problems voiding.    Objective: Vital signs in last 24 hours: Temp:  [97.7 F (36.5 C)-98.5 F (36.9 C)] 98.3 F (36.8 C) (09/30 0440) Pulse Rate:  [50-100] 50 (09/30 0440) Resp:  [15-23] 18 (09/30 0440) BP: (93-107)/(48-80) 93/58 mmHg (09/30 0440) SpO2:  [96 %-99 %] 96 % (09/30 0440)  Physical Exam:  General: alert and cooperative Lochia: appropriate Uterine Fundus:firm Incision: healing well, small drainage noted on bandage DVT Evaluation: No evidence of DVT seen on physical exam. Negative Homan's sign. No cords or calf tenderness. No significant calf/ankle edema.   Recent Labs  09/16/15 0925 09/19/15 0555  HGB 12.5 10.9*  HCT 37.1 33.2*    Assessment/Plan: Status post Cesarean section. Doing well postoperatively.  Continue current care Desires early discharge in am.  CURTIS,CAROL G 09/19/2015, 8:02 AM

## 2015-09-20 ENCOUNTER — Encounter (HOSPITAL_COMMUNITY): Payer: Self-pay | Admitting: Obstetrics & Gynecology

## 2015-09-20 MED ORDER — OXYCODONE-ACETAMINOPHEN 5-325 MG PO TABS
1.0000 | ORAL_TABLET | ORAL | Status: DC | PRN
Start: 2015-09-20 — End: 2020-05-29

## 2015-09-20 MED ORDER — IBUPROFEN 600 MG PO TABS: 600.0000 mg | ORAL_TABLET | Freq: Four times a day (QID) | ORAL | Status: DC

## 2015-09-20 MED ORDER — SERTRALINE HCL 25 MG PO TABS: ORAL_TABLET | ORAL | Status: DC

## 2015-09-20 NOTE — Discharge Summary (Signed)
OB Discharge Summary     Patient Name: Katrina Miller DOB: 02-02-1986 MRN: [id number]  Date of admission: 09/18/2015 Delivering MD: Mitchel Honour   Date of discharge: 09/20/2015  Admitting diagnosis: csection repeat edc Allergic to amoxicillin, septra Intrauterine pregnancy: [redacted]w[redacted]d     Secondary diagnosis: Previous C/S x 1     Discharge diagnosis: Term Pregnancy Delivered                                                                                                Post partum procedures:n/a  Augmentation: n/a  Complications: None  Hospital course:  Sceduled C/S   29 y.o. yo G2P2001 at [redacted]w[redacted]d was admitted to the hospital 09/18/2015 for scheduled cesarean section with the following indication:Elective Repeat.  Membrane Rupture Time/Date: 7:57 AM ,09/18/2015   Patient delivered a Viable infant.09/18/2015  Details of operation can be found in separate operative note.  Pateint had an uncomplicated postpartum course.  She is ambulating, tolerating a regular diet, passing flatus, and urinating well. Patient is discharged home in stable condition on No discharge date for patient encounter.          Physical exam  Filed Vitals:   09/19/15 0440 09/19/15 0850 09/19/15 1816 09/20/15 0633  BP: 93/58 103/63 117/74 100/50  Pulse: 50 66 67 52  Temp: 98.3 F (36.8 C) 98.3 F (36.8 C) 98.4 F (36.9 C) 98 F (36.7 C)  TempSrc: Oral Oral Oral Oral  Resp: SpO2: 96%      General: alert, cooperative and no distress Lochia: appropriate Uterine Fundus: firm Incision: Healing well with no significant drainage, No significant erythema, Dressing is clean, dry, and intact DVT Evaluation: No evidence of DVT seen on physical exam. Negative Homan's sign. No cords or calf tenderness. Labs: Lab Results  Component Value Date   WBC 11.7* 09/19/2015   HGB 10.9* 09/19/2015   HCT 33.2* 09/19/2015   MCV 86.7 09/19/2015   PLT 125* 09/19/2015   No flowsheet data found.  Discharge  instruction: per After Visit Summary and "Baby and Me Booklet".  Medications:  Current facility-administered medications:  .  acetaminophen (TYLENOL) tablet 650 mg, 650 mg, Oral, Q4H PRN, Heylee Tant, DO .  witch hazel-glycerin (TUCKS) pad 1 application, 1 application, Topical, PRN **AND** dibucaine (NUPERCAINAL) 1 % rectal ointment 1 application, 1 application, Rectal, PRN, Balinda Heacock, DO .  diphenhydrAMINE (BENADRYL) capsule 25 mg, 25 mg, Oral, Q6H PRN, Niyla Marone, DO .  diphenhydrAMINE (BENADRYL) injection 12.5 mg, 12.5 mg, Intravenous, Q4H PRN **OR** diphenhydrAMINE (BENADRYL) capsule 25 mg, 25 mg, Oral, Q4H PRN, Val Eagle, MD .  ibuprofen (ADVIL,MOTRIN) tablet 600 mg, 600 mg, Oral, 4 times per day, Tredarius Cobern, DO, 600 mg at 09/20/15 0646 .  lactated ringers infusion, , Intravenous, Continuous, Sangeeta Youse, DO, Stopped at 09/19/15 0032 .  lanolin ointment 1 application, 1 application, Topical, PRN, Arik Husmann, DO .  menthol-cetylpyridinium (CEPACOL) lozenge 3 mg, 1 lozenge, Oral, Q2H PRN, Ritchard Paragas, DO .  nalbuphine (NUBAIN) injection 5 mg, 5 mg, Intravenous, Q4H PRN **OR** nalbuphine (  NUBAIN) injection 5 mg, 5 mg, Subcutaneous, Q4H PRN, Val Eagle, MD .  nalbuphine (NUBAIN) injection 5 mg, 5 mg, Intravenous, Once PRN **OR** nalbuphine (NUBAIN) injection 5 mg, 5 mg, Subcutaneous, Once PRN, Val Eagle, MD .  naloxone [facility]) 2 mg in dextrose 5 % 250 mL infusion, 1-4 mcg/kg/hr, Intravenous, Continuous PRN, Val Eagle, MD .  naloxone [facility]) injection 0.4 mg, 0.4 mg, Intravenous, PRN **AND** sodium chloride 0.9 % injection 3 mL, 3 mL, Intravenous, PRN, Val Eagle, MD .  ondansetron [facility]) injection 4 mg, 4 mg, Intravenous, Q8H PRN, Val Eagle, MD .  oxyCODONE-acetaminophen (PERCOCET/ROXICET) 5-325 MG per tablet 1 tablet, 1 tablet, Oral, Q4H PRN, Mitchel Honour, DO, 1 tablet at 09/20/15 0737 .  oxyCODONE-acetaminophen (PERCOCET/ROXICET)  5-325 MG per tablet 2 tablet, 2 tablet, Oral, Q4H PRN, Mitchel Honour, DO .  prenatal multivitamin tablet 1 tablet, 1 tablet, Oral, Q1200, Neev Mcmains, DO, 1 tablet at 09/19/15 1218 .  scopolamine (TRANSDERM-SCOP) 1 MG/3DAYS 1.5 mg, 1 patch, Transdermal, Once, Val Eagle, MD .  senna-docusate (Senokot-S) tablet 2 tablet, 2 tablet, Oral, Q24H, Serria Sloma, DO, 2 tablet at 09/19/15 2318 .  simethicone (MYLICON) chewable tablet 80 mg, 80 mg, Oral, TID PC, Kendyn Zaman, DO, 80 mg at 09/19/15 1800 .  simethicone (MYLICON) chewable tablet 80 mg, 80 mg, Oral, Q24H, Shatasia Cutshaw, DO, 80 mg at 09/19/15 2319 .  simethicone (MYLICON) chewable tablet 80 mg, 80 mg, Oral, PRN, Mitchel Honour, DO .  Tdap (BOOSTRIX) injection 0.5 mL, 0.5 mL, Intramuscular, Once, Nairi Oswald, DO, 0.5 mL at 09/19/15 0804 .  zolpidem (AMBIEN) tablet 5 mg, 5 mg, Oral, QHS PRN, Mitchel Honour, DO  Diet: routine diet  Activity: Advance as tolerated. Pelvic rest for 6 weeks.   Outpatient follow up:1 week incision check  Postpartum contraception: Not Discussed  Newborn Data: Live born female  Birth Weight: 6 lb 12.5 oz (3076 g) APGAR: 8, 9  Baby Feeding: Breast Disposition:home with mother   09/20/2015 Dao Memmott, DO

## 2015-09-20 NOTE — Discharge Instructions (Signed)
Call MD for T>100.4, heavy vaginal bleeding, severe abdominal paint, intractable nausea and/or vomiting, or respiratory distress.  Call office to schedule one week incision check appointment.  No driving while taking narcotics.  Pelvic rest x 6 weeks.

## 2015-09-20 NOTE — Progress Notes (Signed)
Subjective: Postpartum Day 2: Cesarean Delivery Patient reports tolerating PO and no problems voiding.  Patient requests early discharge.  Patient has h/o PPD and requests rx for sertraline.  Objective: Vital signs in last 24 hours: Temp:  [98 F (36.7 C)-98.4 F (36.9 C)] 98 F (36.7 C) (10/01 1610) Pulse Rate:  [52-67] 52 (10/01 0633) Resp:  [18] 18 (10/01 0633) BP: (100-117)/(50-74) 100/50 mmHg (10/01 9604)  Physical Exam:  General: alert, cooperative and appears stated age Lochia: appropriate Uterine Fundus: firm Incision: healing well, no significant drainage, no dehiscence DVT Evaluation: No evidence of DVT seen on physical exam. Negative Homan's sign. No cords or calf tenderness.   Recent Labs  09/19/15 0555  HGB 10.9*  HCT 33.2*    Assessment/Plan: Status post Cesarean section. Doing well postoperatively.  Discharge home with standard precautions and return to clinic in 4-6 weeks.  Katrina Miller 09/20/2015, 10:07 AM

## 2016-05-13 IMAGING — US US MFM OB LIMITED
1 series · 15 of 24 positions shown · non-contrast
Comparison: none

[Series 1: us mfm ob limited · 24 acquisitions, 15 frames shown]
[im 1/24]
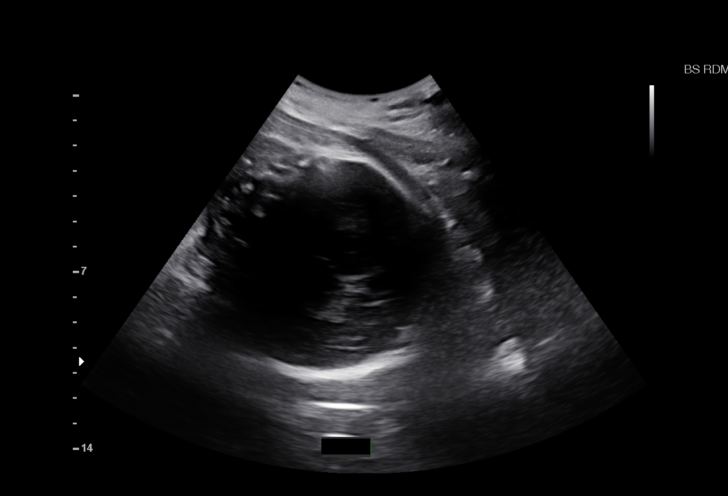
[im 3/24]
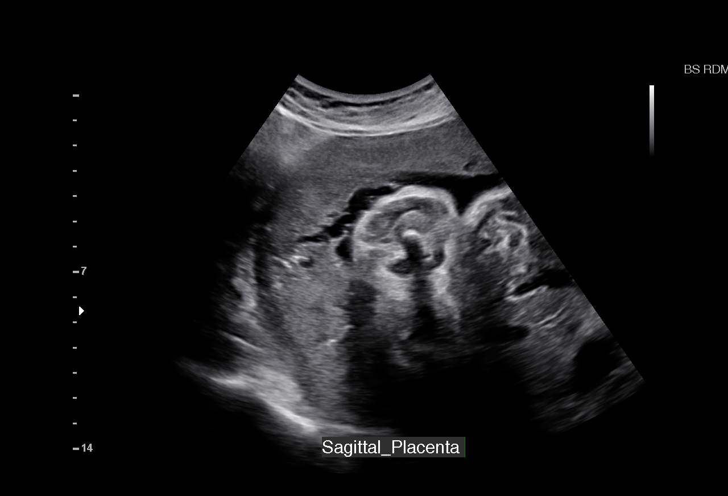
[im 5/24]
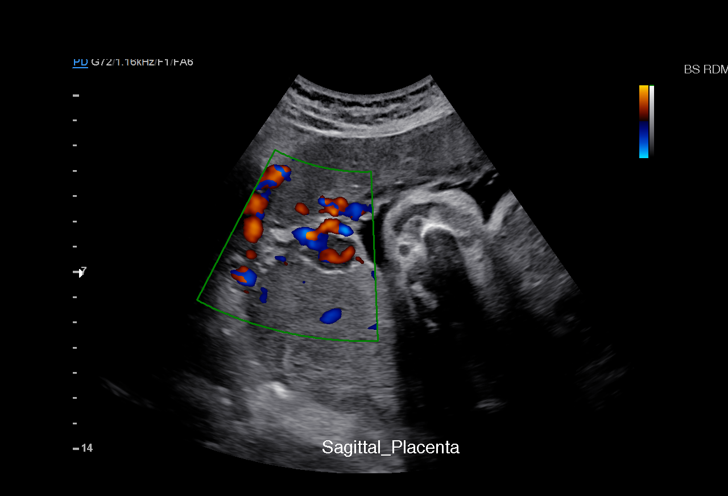
[im 6/24]
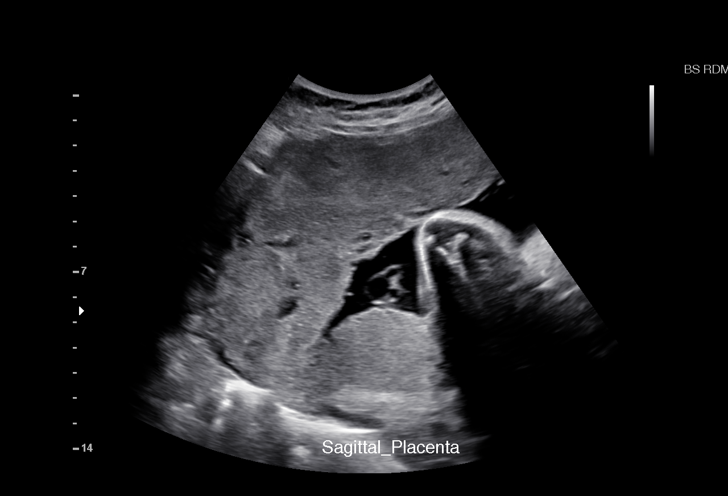
[im 8/24]
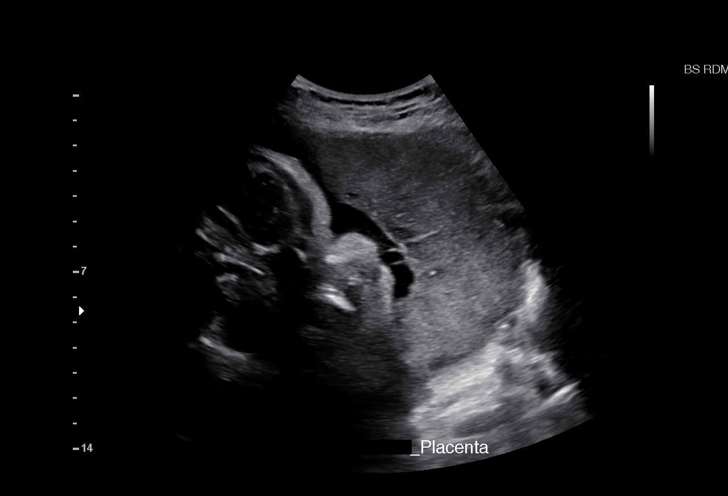
[im 9/24]
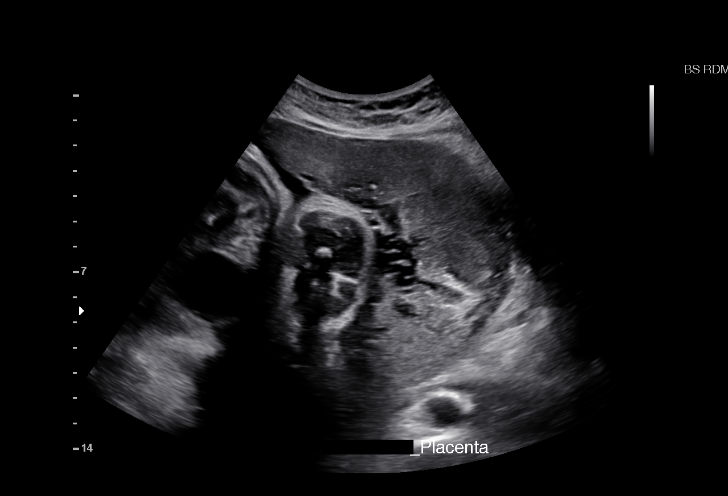
[im 11/24]
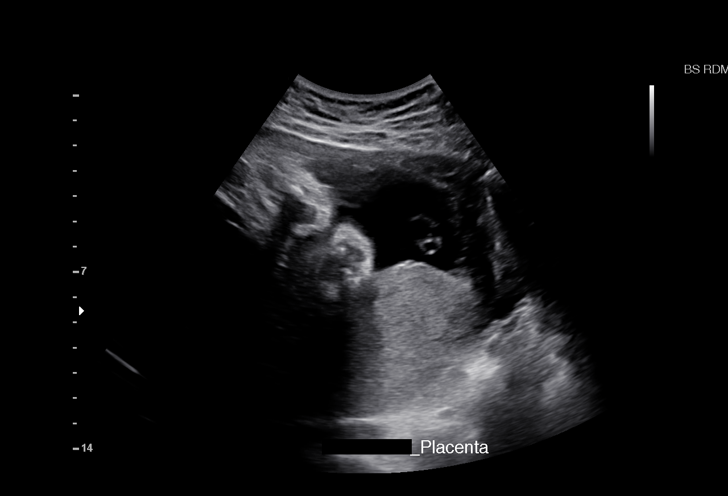
[im 13/24]
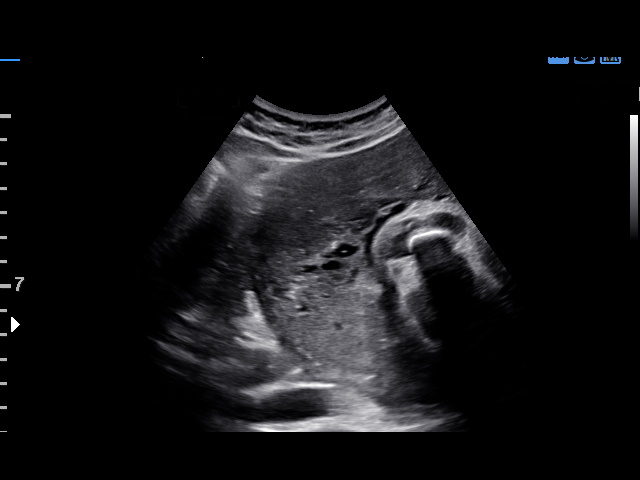
[im 14/24]
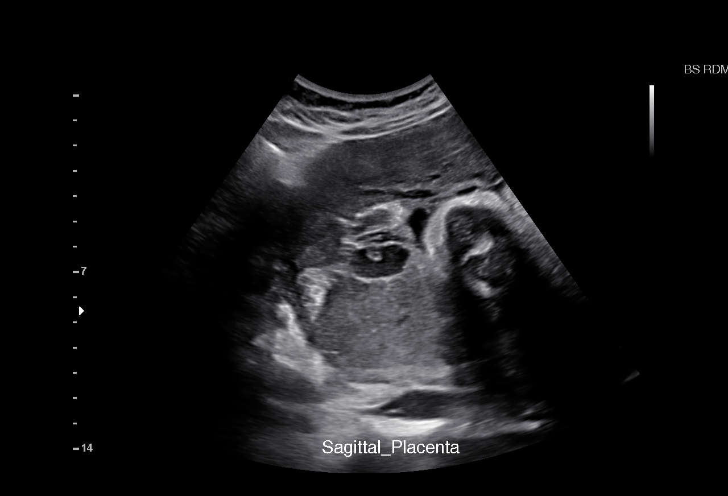
[im 16/24]
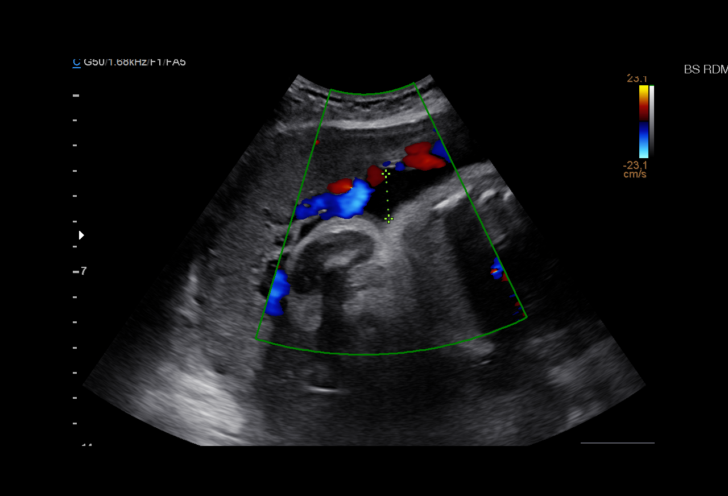
[im 17/24]
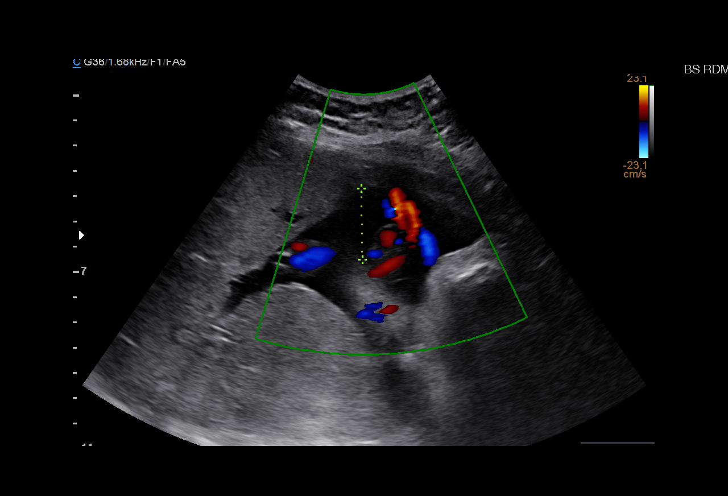
[im 19/24]
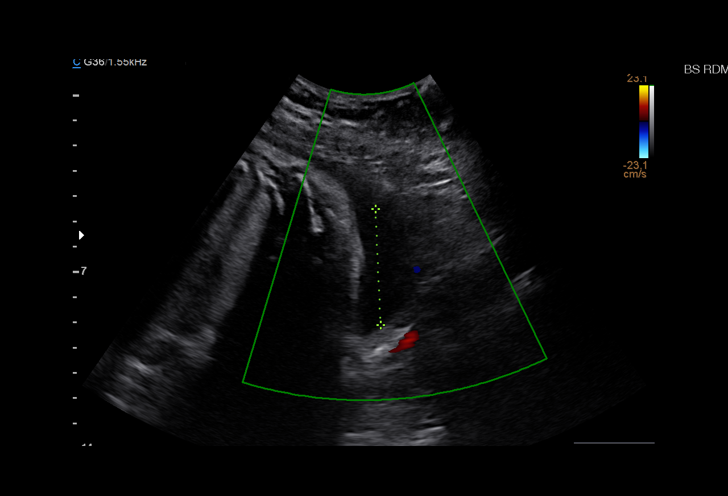
[im 21/24]
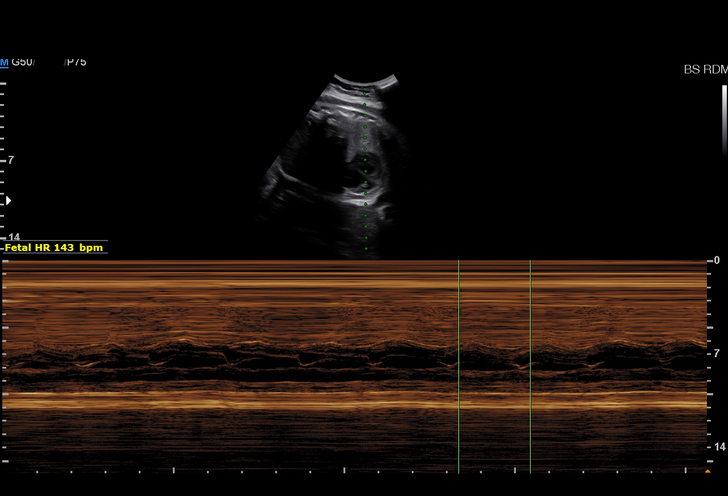
[im 22/24]
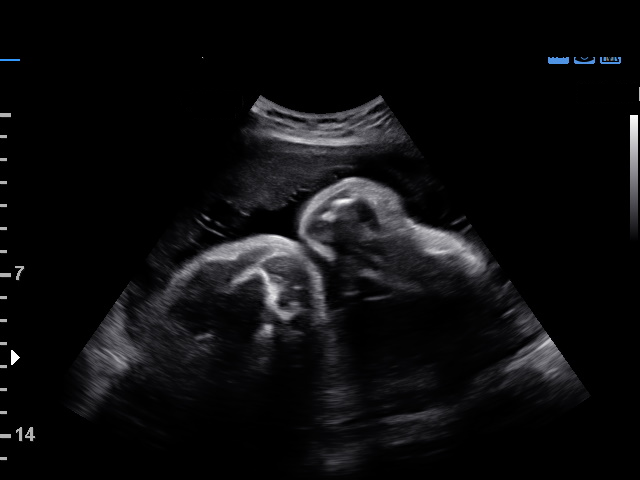
[im 24/24]
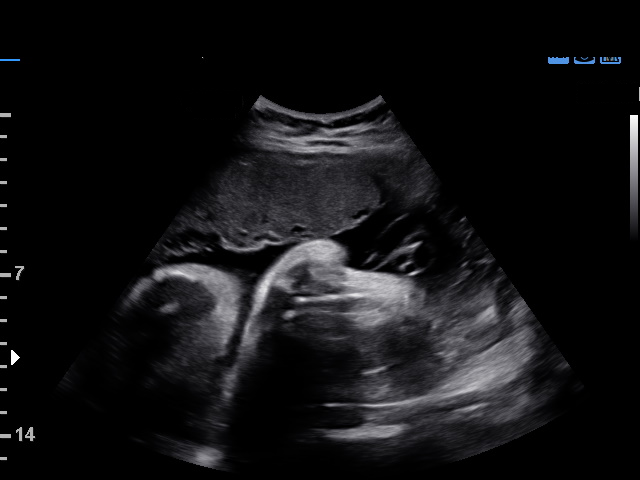

[15 of 24 positions shown; findings below may reference images not displayed]

OBSTETRICS REPORT
(Signed Final 08/24/2015 [DATE])

Service(s) Provided

US MFM OB LIMITED                                     76815.01
Indications

Vaginal bleeding in pregnancy, third trimester
Evaluate placenta
Assess fetal well-being
36 weeks gestation of pregnancy
Fetal Evaluation

Num Of Fetuses:    1
Fetal Heart Rate:  143                          bpm
Cardiac Activity:  Observed
Presentation:      Cephalic
Placenta:          Fundal, above cervical os
P. Cord            Not well visualized
Insertion:

Amniotic Fluid
AFI FV:      Subjectively within normal limits
AFI Sum:     12.04   cm       37  %Tile      Larg Pckt:   4.59  cm
RUQ:   1.78    cm   RLQ:    4.59   cm    LUQ:   2.81    cm    LLQ:   2.86    cm
Biophysical Evaluation

Amniotic F.V:   Pocket => 2 cm two         F. Tone:         Observed
planes
F. Movement:    Observed                   Score:           [DATE]
F. Breathing:   Observed
Gestational Age

LMP:           36w 0d        Date:  12/15/14                 EDD:   09/21/15
Best:          36w 0d     Det. By:  LMP  (12/15/14)          EDD:   09/21/15
Cervix Uterus Adnexa

Cervix:       Not visualized (advanced GA >29wks)
Impression

Single IUP at 36w 0d
Limited ultrasound performed due to episode of vaginal
bleeding
BPP [DATE]
Normal amnioitic fluid volume
Fundal placenta without previa.  No obvious placental
abnormalities. There is a small area within the palcenta -
likely placental lake, but cannot rule out a small abruption
Recommendations

Consider repeat ultrasound in 24-48 hours to reevaluate.  If
stable in size, unlikely to be consistent with abruption.

questions or concerns.

## 2020-05-29 ENCOUNTER — Encounter (HOSPITAL_COMMUNITY): Payer: Self-pay | Admitting: Obstetrics and Gynecology

## 2020-05-29 ENCOUNTER — Other Ambulatory Visit: Payer: Self-pay

## 2020-05-29 NOTE — Progress Notes (Signed)
Pt denies SOB, chest pain, and being under the care of a cardiologist. Pt stated that she receives primary care at [facility]. Pt denies having a stress test, echo and cardiac cath. Pt denies having an EKG and chest x ray. Pt denies recent labs. Pt made aware to stop taking Aspirin (unless otherwise advised by surgeon), vitamins, fish oil and herbal medications. Do not take any NSAIDs ie: Ibuprofen, Advil, Naproxen (Aleve), Motrin, BC and Goody Powder. Pt reminded to quarantine. Pt verbalized understanding of all pre-op instructions.

## 2020-05-29 NOTE — H&P (Signed)
Katrina Miller is an 34 y.o. female. Presenting with MAB diagnosed in the office. Desires surgical management. Hx CS x1.   Pertinent Gynecological History: OB History: G2, P1   Menstrual History: No LMP recorded.    Past Medical History:  Diagnosis Date  . Anxiety    situational  . Chronic kidney disease   . Depression    h/o pp depression  . Hard of hearing   . Kidney calculi 2007    Past Surgical History:  Procedure Laterality Date  . CESAREAN SECTION     x 1  . CESAREAN SECTION N/A 09/18/2015   Procedure: CESAREAN SECTION;  Surgeon: Mitchel Honour, DO;  Location: [facility];  Service: Obstetrics;  Laterality: N/A;  . KIDNEY STONE SURGERY    . WISDOM TOOTH EXTRACTION      Family History  Problem Relation Age of Onset  . Hypertension Mother   . Hypertension Father   . Diabetes Maternal Grandfather   . Cancer Maternal Grandfather   . Heart disease Maternal Grandfather   . Diabetes Paternal Grandmother   . Cancer Paternal Grandfather     Social History:  reports that she has never smoked. She does not have any smokeless tobacco history on file. She reports that she does not drink alcohol and does not use drugs.  Allergies:  Allergies  Allergen Reactions  . Amoxicillin Hives  . Septra [Sulfamethoxazole-Trimethoprim] Hives    No medications prior to admission.    Review of Systems  unknown if currently breastfeeding. Physical Exam  Gen: well appearing, NAD CV: Reg rate Pulm: NWOB Abd: soft, nondistended, nontender, no masses GYN: uterus 7 week size, no adnexa ttp/CMT Ext: No edema b/l   No results found for this or any previous visit (from the past 24 hour(s)).  No results found.  Assessment/Plan: Pt counseled on TVUS results and diagnosis of MAB. Discussed that it is unlikely to be her fault nor could she have prevented it. Reviewed that this miscarriage does not likely reflect her ability to have a successful pregnancy in the future, and that miscarriage is  common - 1:5 pregnancies. She was counseled on options for managing missed ab including expectant, medical, and surgical management.  Patient desires definitive surgical management with suction dilation and curettage.   Risks/benefits/ and alternatives reviewed with patient with risks including but not limited to bleeding, infection, uterine perforation, and damage to nearby structures such as the bowel, bladder, vessels, and/or other organs. She was given opportunity to ask questions and all questions answered. Patient consents to proceed with suction dilation and curettage. Reviewed bleeding precautions. Her blood type is positive and she does not need Rhogam. Given instructions and she verbalizes understanding. Discussed the importance of having at least one normal periods after completion of the process, before attempting conception again. Discussed S/S to call back for. All questions answered and pt verbalizes understanding w/out further questions/concerns. Risks discussed including infection, bleeding, damage to surrounding structures, need for additional procedures, postoperative DVT and subsequent scarring. All questions answered. Consent signed in office.  Plan on doxycycline postop and consider cytotec x 3 days.    Ranae Pila 05/29/2020, 2:42 PM

## 2020-05-30 ENCOUNTER — Ambulatory Visit (HOSPITAL_COMMUNITY): Payer: BLUE CROSS/BLUE SHIELD | Admitting: Certified Registered Nurse Anesthetist

## 2020-05-30 ENCOUNTER — Encounter (HOSPITAL_COMMUNITY): Payer: Self-pay | Admitting: Obstetrics and Gynecology

## 2020-05-30 ENCOUNTER — Ambulatory Visit (HOSPITAL_COMMUNITY)
Admission: RE | Admit: 2020-05-30 | Discharge: 2020-05-30 | Disposition: A | Discharge status: Home / Self Care | Payer: BLUE CROSS/BLUE SHIELD | Attending: Obstetrics and Gynecology | Admitting: Obstetrics and Gynecology

## 2020-05-30 ENCOUNTER — Other Ambulatory Visit: Payer: Self-pay

## 2020-05-30 ENCOUNTER — Encounter (HOSPITAL_COMMUNITY)
Admission: RE | Disposition: A | Discharge status: Home / Self Care | Payer: Self-pay | Source: Home / Self Care | Attending: Obstetrics and Gynecology

## 2020-05-30 DIAGNOSIS — O021 Missed abortion: Secondary | ICD-10-CM | POA: Insufficient documentation

## 2020-05-30 DIAGNOSIS — F419 Anxiety disorder, unspecified: Secondary | ICD-10-CM | POA: Diagnosis not present

## 2020-05-30 DIAGNOSIS — F329 Major depressive disorder, single episode, unspecified: Secondary | ICD-10-CM | POA: Diagnosis not present

## 2020-05-30 DIAGNOSIS — N189 Chronic kidney disease, unspecified: Secondary | ICD-10-CM | POA: Diagnosis not present

## 2020-05-30 DIAGNOSIS — Z20822 Contact with and (suspected) exposure to covid-19: Secondary | ICD-10-CM | POA: Diagnosis not present

## 2020-05-30 HISTORY — DX: Headache, unspecified: R51.9

## 2020-05-30 HISTORY — DX: Nausea with vomiting, unspecified: Z98.890

## 2020-05-30 HISTORY — PX: DILATION AND EVACUATION: SHX1459

## 2020-05-30 HISTORY — DX: Nausea with vomiting, unspecified: R11.2

## 2020-05-30 LAB — CBC
HCT: 38.4 % (ref 36.0–46.0)
Hemoglobin: 12.6 g/dL (ref 12.0–15.0)
MCH: 30.6 pg (ref 26.0–34.0)
MCHC: 32.8 g/dL (ref 30.0–36.0)
MCV: 93.2 fL (ref 80.0–100.0)
Platelets: 163 10*3/uL (ref 150–400)
RBC: 4.12 MIL/uL (ref 3.87–5.11)
RDW: 11.9 % (ref 11.5–15.5)
WBC: 5.2 10*3/uL (ref 4.0–10.5)
nRBC: 0 % (ref 0.0–0.2)

## 2020-05-30 LAB — SARS CORONAVIRUS 2 BY RT PCR (HOSPITAL ORDER, PERFORMED IN ~~LOC~~ HOSPITAL LAB): SARS Coronavirus 2: NEGATIVE

## 2020-05-30 LAB — ABO/RH: ABO/RH(D): O POS

## 2020-05-30 LAB — TYPE AND SCREEN
ABO/RH(D): O POS
Antibody Screen: NEGATIVE

## 2020-05-30 SURGERY — DILATION AND EVACUATION, UTERUS
Anesthesia: Monitor Anesthesia Care | Site: Vagina

## 2020-05-30 MED ORDER — CHLOROPROCAINE HCL 1 % IJ SOLN
INTRAMUSCULAR | Status: DC | PRN
  Administered 2020-05-30: 10 mL

## 2020-05-30 MED ORDER — FENTANYL CITRATE (PF) 100 MCG/2ML IJ SOLN
INTRAMUSCULAR | Status: DC | PRN
  Administered 2020-05-30 (×2): 50 ug via INTRAVENOUS

## 2020-05-30 MED ORDER — ORAL CARE MOUTH RINSE: 15.0000 mL | Freq: Once | OROMUCOSAL | Status: AC

## 2020-05-30 MED ORDER — MIDAZOLAM HCL 2 MG/2ML IJ SOLN
INTRAMUSCULAR | Status: AC
  Filled 2020-05-30: qty 2

## 2020-05-30 MED ORDER — PROPOFOL 500 MG/50ML IV EMUL
INTRAVENOUS | Status: DC | PRN
  Administered 2020-05-30: 125 ug/kg/min via INTRAVENOUS

## 2020-05-30 MED ORDER — MIDAZOLAM HCL 5 MG/5ML IJ SOLN
INTRAMUSCULAR | Status: DC | PRN
  Administered 2020-05-30: 2 mg via INTRAVENOUS

## 2020-05-30 MED ORDER — 0.9 % SODIUM CHLORIDE (POUR BTL) OPTIME
TOPICAL | Status: DC | PRN
  Administered 2020-05-30: 1000 mL

## 2020-05-30 MED ORDER — MISOPROSTOL 200 MCG PO TABS
200.0000 ug | ORAL_TABLET | Freq: Two times a day (BID) | ORAL | 0 refills | Status: AC
Start: 2020-05-30 — End: 2020-06-02

## 2020-05-30 MED ORDER — DOXYCYCLINE MONOHYDRATE 100 MG PO TABS
100.0000 mg | ORAL_TABLET | Freq: Two times a day (BID) | ORAL | 0 refills | Status: AC
Start: 2020-05-30 — End: 2020-06-02

## 2020-05-30 MED ORDER — PROPOFOL 10 MG/ML IV BOLUS
INTRAVENOUS | Status: DC | PRN
  Administered 2020-05-30: 20 mg via INTRAVENOUS
  Administered 2020-05-30: 15 mg via INTRAVENOUS

## 2020-05-30 MED ORDER — PHENYLEPHRINE HCL (PRESSORS) 10 MG/ML IV SOLN
INTRAVENOUS | Status: DC | PRN
  Administered 2020-05-30: 40 ug via INTRAVENOUS

## 2020-05-30 MED ORDER — CHLORHEXIDINE GLUCONATE 0.12 % MT SOLN
15.0000 mL | Freq: Once | OROMUCOSAL | Status: AC
  Administered 2020-05-30: 15 mL via OROMUCOSAL

## 2020-05-30 MED ORDER — LACTATED RINGERS IV SOLN: INTRAVENOUS | Status: DC

## 2020-05-30 MED ORDER — FENTANYL CITRATE (PF) 250 MCG/5ML IJ SOLN
INTRAMUSCULAR | Status: AC
  Filled 2020-05-30: qty 5

## 2020-05-30 MED ORDER — ONDANSETRON HCL 4 MG/2ML IJ SOLN
INTRAMUSCULAR | Status: AC
  Filled 2020-05-30: qty 2

## 2020-05-30 MED ORDER — ONDANSETRON HCL 4 MG/2ML IJ SOLN
INTRAMUSCULAR | Status: DC | PRN
  Administered 2020-05-30: 4 mg via INTRAVENOUS

## 2020-05-30 MED ORDER — PHENYLEPHRINE 40 MCG/ML (10ML) SYRINGE FOR IV PUSH (FOR BLOOD PRESSURE SUPPORT)
PREFILLED_SYRINGE | INTRAVENOUS | Status: AC
  Filled 2020-05-30: qty 10

## 2020-05-30 MED ORDER — CHLOROPROCAINE HCL 1 % IJ SOLN
INTRAMUSCULAR | Status: AC
  Filled 2020-05-30: qty 30

## 2020-05-30 SURGICAL SUPPLY — 23 items
CATH ROBINSON RED A/P 16FR (CATHETERS) ×3 IMPLANT
DECANTER SPIKE VIAL GLASS SM (MISCELLANEOUS) ×3 IMPLANT
FILTER UTR ASPR ASSEMBLY (MISCELLANEOUS) ×3 IMPLANT
GLOVE BIO SURGEON STRL SZ 6.5 (GLOVE) ×2 IMPLANT
GLOVE BIO SURGEONS STRL SZ 6.5 (GLOVE) ×1
GLOVE BIOGEL PI IND STRL 7.0 (GLOVE) ×2 IMPLANT
GLOVE BIOGEL PI INDICATOR 7.0 (GLOVE) ×4
GOWN STRL REUS W/ TWL LRG LVL3 (GOWN DISPOSABLE) ×2 IMPLANT
GOWN STRL REUS W/TWL LRG LVL3 (GOWN DISPOSABLE) ×6
HOSE CONNECTING 18IN BERKELEY (TUBING) ×3 IMPLANT
KIT BERKELEY 1ST TRI 3/8 NO TR (MISCELLANEOUS) ×3 IMPLANT
KIT BERKELEY 1ST TRIMESTER 3/8 (MISCELLANEOUS) ×3 IMPLANT
NS IRRIG 1000ML POUR BTL (IV SOLUTION) ×3 IMPLANT
PACK VAGINAL MINOR WOMEN LF (CUSTOM PROCEDURE TRAY) ×3 IMPLANT
PAD OB MATERNITY 4.3X12.25 (PERSONAL CARE ITEMS) ×3 IMPLANT
SET BERKELEY SUCTION TUBING (SUCTIONS) ×3 IMPLANT
TOWEL GREEN STERILE FF (TOWEL DISPOSABLE) ×6 IMPLANT
UNDERPAD 30X36 HEAVY ABSORB (UNDERPADS AND DIAPERS) ×3 IMPLANT
VACURETTE 10 RIGID CVD (CANNULA) IMPLANT
VACURETTE 6 ASPIR F TIP BERK (CANNULA) ×3 IMPLANT
VACURETTE 7MM CVD STRL WRAP (CANNULA) IMPLANT
VACURETTE 8 RIGID CVD (CANNULA) IMPLANT
VACURETTE 9 RIGID CVD (CANNULA) IMPLANT

## 2020-05-30 NOTE — Progress Notes (Signed)
No updates to above H&P. Patient arrived NPO and was consented in PACU. Risks again discussed, all questions answered, and consent signed. Proceed with above surgery.    Osiel Stick MD  

## 2020-05-30 NOTE — Transfer of Care (Signed)
Immediate Anesthesia Transfer of Care Note  Patient: Katrina Miller  Procedure(s) Performed: DILATATION AND EVACUATION (N/A Vagina )  Patient Location: PACU  Anesthesia Type:MAC  Level of Consciousness: awake and alert   Airway & Oxygen Therapy: Patient Spontanous Breathing  Post-op Assessment: Report given to RN and Post -op Vital signs reviewed and stable  Post vital signs: Reviewed and stable  Last Vitals:  Vitals Value Taken Time  BP 105/60 05/30/20 1409  Temp    Pulse 70 05/30/20 1411  Resp 16 05/30/20 1411  SpO2 95 % 05/30/20 1411  Vitals shown include unvalidated device data.  Last Pain:  Vitals:   05/30/20 1409  TempSrc:   PainSc: (P) Asleep      Patients Stated Pain Goal: 5 (36/62/94 7654)  Complications: No complications documented.

## 2020-05-30 NOTE — Anesthesia Procedure Notes (Signed)
Procedure Name: MAC Date/Time: 05/30/2020 1:35 PM Performed by: Inda Coke, CRNA Pre-anesthesia Checklist: Patient identified, Emergency Drugs available, Suction available, Timeout performed and Patient being monitored Patient Re-evaluated:Patient Re-evaluated prior to induction Oxygen Delivery Method: Simple face mask Induction Type: IV induction Dental Injury: Teeth and Oropharynx as per pre-operative assessment

## 2020-05-30 NOTE — Anesthesia Postprocedure Evaluation (Signed)
Anesthesia Post Note  Patient: Katrina Miller  Procedure(s) Performed: DILATATION AND EVACUATION (N/A Vagina )     Patient location during evaluation: PACU Anesthesia Type: MAC Level of consciousness: awake and alert Pain management: pain level controlled Vital Signs Assessment: post-procedure vital signs reviewed and stable Respiratory status: spontaneous breathing, nonlabored ventilation, respiratory function stable and patient connected to nasal cannula oxygen Cardiovascular status: stable and blood pressure returned to baseline Postop Assessment: no apparent nausea or vomiting Anesthetic complications: no   No complications documented.  Last Vitals:  Vitals:   05/30/20 1441 05/30/20 1442  BP:    Pulse: (!) 57 62  Resp: 16   Temp:  36.6 C  SpO2: 96% 98%    Last Pain:  Vitals:   05/30/20 1415  TempSrc:   PainSc: 0-No pain                 [name]

## 2020-05-30 NOTE — Anesthesia Preprocedure Evaluation (Addendum)
Anesthesia Evaluation  Patient identified by MRN, date of birth, ID band Patient awake    Reviewed: Allergy & Precautions, NPO status , Patient's Chart, lab work & pertinent test results  History of Anesthesia Complications (+) PONV and history of anesthetic complications  Airway Mallampati: II  TM Distance: >3 FB Neck ROM: Full    Dental  (+) Teeth Intact, Dental Advisory Given   Pulmonary neg pulmonary ROS,    breath sounds clear to auscultation       Cardiovascular negative cardio ROS   Rhythm:Regular Rate:Normal     Neuro/Psych  Headaches, PSYCHIATRIC DISORDERS Anxiety Depression    GI/Hepatic negative GI ROS, Neg liver ROS,   Endo/Other  negative endocrine ROS  Renal/GU      Musculoskeletal negative musculoskeletal ROS (+)   Abdominal Normal abdominal exam  (+)   Peds  Hematology negative hematology ROS (+)   Anesthesia Other Findings   Reproductive/Obstetrics                            Anesthesia Physical Anesthesia Plan  ASA: II  Anesthesia Plan: MAC   Post-op Pain Management:    Induction: Intravenous  PONV Risk Score and Plan: 3 and Propofol infusion, Ondansetron and Midazolam  Airway Management Planned: Natural Airway and Simple Face Mask  Additional Equipment: None  Intra-op Plan:   Post-operative Plan:   Informed Consent: I have reviewed the patients History and Physical, chart, labs and discussed the procedure including the risks, benefits and alternatives for the proposed anesthesia with the patient or authorized representative who has indicated his/her understanding and acceptance.     Dental advisory given  Plan Discussed with: CRNA  Anesthesia Plan Comments:        Anesthesia Quick Evaluation

## 2020-05-30 NOTE — Op Note (Signed)
PREOPERATIVE DIAGNOSES: 1. Missed Abortion  POSTOPERATIVE DIAGNOSES: Same  PROCEDURE PERFORMED: Dilation, suction, sharp curretage  SURGEON: Dr. Belva Agee  ANESTHESIA: Paracervical block and IV sedation  ESTIMATED BLOOD LOSS: 50cc.  COMPLICATIONS: None  TUBES: None.  DRAINS: None  PATHOLOGY: Endometrial curettings  FINDINGS: On exam, under anesthesia, normal appearing vulva and vagina, 8 week sized uterus  Operative findings demonstrated plethora of POCs  Procedure: The patient was taken to the operating room where she was properly prepped and draped in sterile manner under general anesthesia. After bimanual examination, the cervix was exposed with a speculum and the anterior lip of the cervix grasped with a tenaculum. Paracervical block performed. The endocervical canal was then progressively dilated to 73mm. Suction catheter was introduced into the uterus and to the uterine fundus. The uterus was evacuated and good tissue return was noted. A sharp curettage was then performed until gritty texture noted. All instruments were removed from vagina. The sponge and lap counts were correct times 2 at this time. The patient's procedure was terminated. We then awakened her. She was sent to the Recovery Room in good condition.    Belva Agee MD

## 2020-05-31 ENCOUNTER — Encounter (HOSPITAL_COMMUNITY): Payer: Self-pay | Admitting: Obstetrics and Gynecology

## 2020-06-02 LAB — SURGICAL PATHOLOGY
